# Patient Record
Sex: Male | Born: 1935 | Race: Black or African American | Hispanic: No | Marital: Married | Smoking: Former smoker
Health system: Southern US, Community
[De-identification: ages and names within clinical notes are randomized; demographics above are authoritative.]

## PROBLEM LIST (undated history)

## (undated) DIAGNOSIS — E039 Hypothyroidism, unspecified: Secondary | ICD-10-CM

## (undated) DIAGNOSIS — I2510 Atherosclerotic heart disease of native coronary artery without angina pectoris: Secondary | ICD-10-CM

## (undated) DIAGNOSIS — N189 Chronic kidney disease, unspecified: Secondary | ICD-10-CM

## (undated) DIAGNOSIS — I509 Heart failure, unspecified: Secondary | ICD-10-CM

## (undated) DIAGNOSIS — M1990 Unspecified osteoarthritis, unspecified site: Secondary | ICD-10-CM

## (undated) DIAGNOSIS — Z972 Presence of dental prosthetic device (complete) (partial): Secondary | ICD-10-CM

## (undated) DIAGNOSIS — I10 Essential (primary) hypertension: Secondary | ICD-10-CM

## (undated) DIAGNOSIS — J849 Interstitial pulmonary disease, unspecified: Secondary | ICD-10-CM

## (undated) DIAGNOSIS — E790 Hyperuricemia without signs of inflammatory arthritis and tophaceous disease: Secondary | ICD-10-CM

## (undated) DIAGNOSIS — D649 Anemia, unspecified: Secondary | ICD-10-CM

## (undated) DIAGNOSIS — G4730 Sleep apnea, unspecified: Secondary | ICD-10-CM

## (undated) DIAGNOSIS — K219 Gastro-esophageal reflux disease without esophagitis: Secondary | ICD-10-CM

## (undated) HISTORY — PX: COLONOSCOPY: SHX174

## (undated) HISTORY — DX: Anemia, unspecified: D64.9

## (undated) HISTORY — DX: Heart failure, unspecified: I50.9

## (undated) HISTORY — PX: ESOPHAGOGASTRODUODENOSCOPY: SHX1529

## (undated) HISTORY — PX: CERVICAL FUSION: SHX112

## (undated) HISTORY — PX: JOINT REPLACEMENT: SHX530

---

## 1990-05-18 HISTORY — PX: CORONARY ARTERY BYPASS GRAFT: SHX141

## 2002-07-18 DIAGNOSIS — M069 Rheumatoid arthritis, unspecified: Secondary | ICD-10-CM | POA: Insufficient documentation

## 2002-07-18 DIAGNOSIS — Z9889 Other specified postprocedural states: Secondary | ICD-10-CM | POA: Insufficient documentation

## 2004-04-02 ENCOUNTER — Ambulatory Visit: Payer: Self-pay | Admitting: Podiatry

## 2004-04-02 ENCOUNTER — Other Ambulatory Visit: Payer: Self-pay

## 2004-04-04 ENCOUNTER — Ambulatory Visit: Payer: Self-pay | Admitting: Podiatry

## 2005-07-02 ENCOUNTER — Other Ambulatory Visit: Payer: Self-pay

## 2005-07-02 ENCOUNTER — Ambulatory Visit: Payer: Self-pay | Admitting: Podiatry

## 2005-07-08 ENCOUNTER — Ambulatory Visit: Payer: Self-pay | Admitting: Podiatry

## 2008-10-04 ENCOUNTER — Ambulatory Visit: Payer: Self-pay | Admitting: Gastroenterology

## 2009-02-05 ENCOUNTER — Ambulatory Visit: Payer: Self-pay | Admitting: Rheumatology

## 2009-03-21 ENCOUNTER — Ambulatory Visit: Payer: Self-pay | Admitting: Rheumatology

## 2009-05-20 DIAGNOSIS — M4712 Other spondylosis with myelopathy, cervical region: Secondary | ICD-10-CM | POA: Insufficient documentation

## 2010-03-21 DIAGNOSIS — I10 Essential (primary) hypertension: Secondary | ICD-10-CM | POA: Insufficient documentation

## 2010-11-03 ENCOUNTER — Ambulatory Visit: Payer: Self-pay | Admitting: Podiatry

## 2010-11-07 ENCOUNTER — Ambulatory Visit: Payer: Self-pay | Admitting: Podiatry

## 2011-02-04 ENCOUNTER — Ambulatory Visit: Payer: Self-pay | Admitting: Podiatry

## 2012-02-05 DIAGNOSIS — N400 Enlarged prostate without lower urinary tract symptoms: Secondary | ICD-10-CM | POA: Insufficient documentation

## 2012-02-05 DIAGNOSIS — I2510 Atherosclerotic heart disease of native coronary artery without angina pectoris: Secondary | ICD-10-CM | POA: Insufficient documentation

## 2012-02-05 DIAGNOSIS — E7849 Other hyperlipidemia: Secondary | ICD-10-CM | POA: Insufficient documentation

## 2013-01-02 DIAGNOSIS — D638 Anemia in other chronic diseases classified elsewhere: Secondary | ICD-10-CM | POA: Insufficient documentation

## 2013-05-28 ENCOUNTER — Ambulatory Visit: Payer: Self-pay | Admitting: Internal Medicine

## 2013-07-10 DIAGNOSIS — N183 Chronic kidney disease, stage 3 unspecified: Secondary | ICD-10-CM | POA: Insufficient documentation

## 2014-01-02 DIAGNOSIS — M1990 Unspecified osteoarthritis, unspecified site: Secondary | ICD-10-CM | POA: Insufficient documentation

## 2014-04-05 DIAGNOSIS — D126 Benign neoplasm of colon, unspecified: Secondary | ICD-10-CM | POA: Insufficient documentation

## 2014-04-05 DIAGNOSIS — K635 Polyp of colon: Secondary | ICD-10-CM | POA: Insufficient documentation

## 2014-05-08 ENCOUNTER — Ambulatory Visit: Payer: Self-pay | Admitting: Physician Assistant

## 2015-06-24 ENCOUNTER — Encounter
Admission: RE | Admit: 2015-06-24 | Discharge: 2015-06-24 | Disposition: A | Discharge status: Home / Self Care | Payer: Medicare HMO | Source: Ambulatory Visit | Attending: Podiatry | Admitting: Podiatry

## 2015-06-24 DIAGNOSIS — Z01810 Encounter for preprocedural cardiovascular examination: Secondary | ICD-10-CM | POA: Insufficient documentation

## 2015-06-24 DIAGNOSIS — Z01812 Encounter for preprocedural laboratory examination: Secondary | ICD-10-CM | POA: Diagnosis present

## 2015-06-24 HISTORY — DX: Hyperuricemia without signs of inflammatory arthritis and tophaceous disease: E79.0

## 2015-06-24 HISTORY — DX: Chronic kidney disease, unspecified: N18.9

## 2015-06-24 HISTORY — DX: Essential (primary) hypertension: I10

## 2015-06-24 HISTORY — DX: Hypothyroidism, unspecified: E03.9

## 2015-06-24 HISTORY — DX: Atherosclerotic heart disease of native coronary artery without angina pectoris: I25.10

## 2015-06-24 HISTORY — DX: Unspecified osteoarthritis, unspecified site: M19.90

## 2015-06-24 LAB — BASIC METABOLIC PANEL
Anion gap: 7 (ref 5–15)
BUN: 23 mg/dL — AB (ref 6–20)
CHLORIDE: 96 mmol/L — AB (ref 101–111)
CO2: 28 mmol/L (ref 22–32)
Calcium: 9.3 mg/dL (ref 8.9–10.3)
Creatinine, Ser: 1.59 mg/dL — ABNORMAL HIGH (ref 0.61–1.24)
GFR calc Af Amer: 46 mL/min — ABNORMAL LOW (ref >60)
GFR calc non Af Amer: 40 mL/min — ABNORMAL LOW (ref >60)
GLUCOSE: 98 mg/dL (ref 65–99)
POTASSIUM: 3.8 mmol/L (ref 3.5–5.1)
Sodium: 131 mmol/L — ABNORMAL LOW (ref 135–145)

## 2015-06-24 LAB — CBC
HEMATOCRIT: 31.9 % — AB (ref 40.0–52.0)
Hemoglobin: 11.2 g/dL — ABNORMAL LOW (ref 13.0–18.0)
MCH: 33.7 pg (ref 26.0–34.0)
MCHC: 35 g/dL (ref 32.0–36.0)
MCV: 96.2 fL (ref 80.0–100.0)
PLATELETS: 155 10*3/uL (ref 150–440)
RBC: 3.32 MIL/uL — AB (ref 4.40–5.90)
RDW: 13.5 % (ref 11.5–14.5)
WBC: 4.2 10*3/uL (ref 3.8–10.6)

## 2015-06-24 NOTE — Pre-Procedure Instructions (Addendum)
Spoke to DR Laureen Abrahams regarding todays ECG (SR 1st AV Block, anteroseptal infarct age undetermined) found below  ECG dated 03/17/02 that notes possible anteroseptal infarct. No clearance requested.  Component Name Value Range  EKG Ventricular Rate 47 bpm   EKG P-R Interval 216 ms  EKG QRS Duration 106 ms  EKG Q-T Interval 474 ms  EKG QTC Calculation 419 ms  EKG Calculated P Axis 39 degrees   EKG Calculated R Axis 19 degrees   EKG Calculated T Axis 105 degrees    Result Narrative  MARKED SINUS BRADYCARDIA WITH PROLONGED PR INTERVAL CANNOT RULE OUT LEFT VENTRICULAR HYPERTROPHY POSSIBLE ANTEROSEPTAL INFARCT (POOR R WAVE PROGRESSION V1-3) , AGE UNDETERMINED WHEN COMPARED WITH ECG OF 17-Mar-2002 17:40, QUESTIONABLE CHANGE IN THE AXIS OPD /CA Electronically Signed By Priscella Mann MD, Mateo Flow, 21-Apr-2002 15:59:29   Status Results Details   Encounter Summary

## 2015-06-24 NOTE — Patient Instructions (Signed)
  Your procedure is scheduled on: Friday 07/05/15 Report to Day Surgery. [facility]ND [facility] To find out your arrival time please call [phone] between 1PM - 3PM on Thursday 07/04/15.  Remember: Instructions that are not followed completely may result in serious medical risk, up to and including death, or upon the discretion of your surgeon and anesthesiologist your surgery may need to be rescheduled.    __X__ 1. Do not eat food or drink liquids after midnight. No gum chewing or hard candies.     __X__ 2. No Alcohol for 24 hours before or after surgery.   ____ 3. Bring all medications with you on the day of surgery if instructed.    __X__ 4. Notify your doctor if there is any change in your medical condition     (cold, fever, infections).     Do not wear jewelry, make-up, hairpins, clips or nail polish.  Do not wear lotions, powders, or perfumes.   Do not shave 48 hours prior to surgery. Men may shave face and neck.  Do not bring valuables to the hospital.    [facility] is not responsible for any belongings or valuables.               Contacts, dentures or bridgework may not be worn into surgery.  Leave your suitcase in the car. After surgery it may be brought to your room.  For patients admitted to the hospital, discharge time is determined by your                treatment team.   Patients discharged the day of surgery will not be allowed to drive home.   Please read over the following fact sheets that you were given:   Surgical Site Infection Prevention   __X__ Take these medicines the morning of surgery with A SIP OF WATER:    1. LEVOTHYROXINE  2. OMEPRAZOLE  3.  TERAZOSYN  4.   5.  6.  ____ Fleet Enema (as directed)   __X__ Use CHG Soap as directed  ____ Use inhalers on the day of surgery  ____ Stop metformin 2 days prior to surgery    ____ Take 1/2 of usual insulin dose the night before surgery and none on the morning of surgery.   _X___ Stop  Coumadin/Plavix/aspirin on 06/25/2015  _X___ Stop Anti-inflammatories on 06/28/15   _X___ Stop supplements until after surgery.  HOLD [id number] AND FOLIC ACID UNTIL AFTER SURGERY  ____ Bring C-Pap to the hospital.

## 2015-06-24 NOTE — Pre-Procedure Instructions (Signed)
CBC and Met B faxed to Dr Elvina Mattes

## 2015-06-28 NOTE — Pre-Procedure Instructions (Signed)
EKG sent to Dr. Elvina Mattes and Anesthesia for review.

## 2015-06-28 NOTE — Pre-Procedure Instructions (Signed)
Lucina Mellow called Dr. Rosey Bath regarding pt's EKG done 06/24/15 as compared to EKG done on 07/02/2005.  Dr. Rosey Bath said OK to proceed.

## 2015-07-01 DIAGNOSIS — M545 Low back pain, unspecified: Secondary | ICD-10-CM | POA: Insufficient documentation

## 2015-07-05 ENCOUNTER — Encounter: Payer: Self-pay | Admitting: *Deleted

## 2015-07-05 ENCOUNTER — Encounter
Admission: RE | Disposition: A | Discharge status: Home / Self Care | Payer: Self-pay | Source: Ambulatory Visit | Attending: Podiatry

## 2015-07-05 ENCOUNTER — Ambulatory Visit: Payer: Medicare HMO | Admitting: Certified Registered Nurse Anesthetist

## 2015-07-05 ENCOUNTER — Ambulatory Visit
Admission: RE | Admit: 2015-07-05 | Discharge: 2015-07-05 | Disposition: A | Discharge status: Home / Self Care | Payer: Medicare HMO | Source: Ambulatory Visit | Attending: Podiatry | Admitting: Podiatry

## 2015-07-05 DIAGNOSIS — Z7982 Long term (current) use of aspirin: Secondary | ICD-10-CM | POA: Insufficient documentation

## 2015-07-05 DIAGNOSIS — Z86010 Personal history of colonic polyps: Secondary | ICD-10-CM | POA: Insufficient documentation

## 2015-07-05 DIAGNOSIS — Z96641 Presence of right artificial hip joint: Secondary | ICD-10-CM | POA: Diagnosis not present

## 2015-07-05 DIAGNOSIS — E790 Hyperuricemia without signs of inflammatory arthritis and tophaceous disease: Secondary | ICD-10-CM | POA: Insufficient documentation

## 2015-07-05 DIAGNOSIS — M899 Disorder of bone, unspecified: Secondary | ICD-10-CM | POA: Insufficient documentation

## 2015-07-05 DIAGNOSIS — I129 Hypertensive chronic kidney disease with stage 1 through stage 4 chronic kidney disease, or unspecified chronic kidney disease: Secondary | ICD-10-CM | POA: Diagnosis not present

## 2015-07-05 DIAGNOSIS — M069 Rheumatoid arthritis, unspecified: Secondary | ICD-10-CM | POA: Diagnosis not present

## 2015-07-05 DIAGNOSIS — M5010 Cervical disc disorder with radiculopathy, unspecified cervical region: Secondary | ICD-10-CM | POA: Diagnosis not present

## 2015-07-05 DIAGNOSIS — Q660 Congenital talipes equinovarus: Secondary | ICD-10-CM | POA: Insufficient documentation

## 2015-07-05 DIAGNOSIS — K219 Gastro-esophageal reflux disease without esophagitis: Secondary | ICD-10-CM | POA: Diagnosis not present

## 2015-07-05 DIAGNOSIS — E739 Lactose intolerance, unspecified: Secondary | ICD-10-CM | POA: Insufficient documentation

## 2015-07-05 DIAGNOSIS — G8929 Other chronic pain: Secondary | ICD-10-CM | POA: Diagnosis present

## 2015-07-05 DIAGNOSIS — E039 Hypothyroidism, unspecified: Secondary | ICD-10-CM | POA: Diagnosis not present

## 2015-07-05 DIAGNOSIS — I2510 Atherosclerotic heart disease of native coronary artery without angina pectoris: Secondary | ICD-10-CM | POA: Diagnosis not present

## 2015-07-05 DIAGNOSIS — N189 Chronic kidney disease, unspecified: Secondary | ICD-10-CM | POA: Insufficient documentation

## 2015-07-05 DIAGNOSIS — Z79899 Other long term (current) drug therapy: Secondary | ICD-10-CM | POA: Diagnosis not present

## 2015-07-05 DIAGNOSIS — N400 Enlarged prostate without lower urinary tract symptoms: Secondary | ICD-10-CM | POA: Diagnosis not present

## 2015-07-05 DIAGNOSIS — M5136 Other intervertebral disc degeneration, lumbar region: Secondary | ICD-10-CM | POA: Insufficient documentation

## 2015-07-05 DIAGNOSIS — M479 Spondylosis, unspecified: Secondary | ICD-10-CM | POA: Insufficient documentation

## 2015-07-05 HISTORY — PX: GASTROC RECESSION EXTREMITY: SHX6262

## 2015-07-05 HISTORY — PX: OSTECTOMY: SHX6439

## 2015-07-05 SURGERY — OSTECTOMY
Anesthesia: General | Site: Foot | Laterality: Left | Wound class: Clean

## 2015-07-05 MED ORDER — BUPIVACAINE HCL (PF) 0.5 % IJ SOLN
INTRAMUSCULAR | Status: AC
  Filled 2015-07-05: qty 30

## 2015-07-05 MED ORDER — CEFAZOLIN SODIUM-DEXTROSE 2-3 GM-% IV SOLR
2.0000 g | Freq: Once | INTRAVENOUS | Status: AC
  Administered 2015-07-05: 2 g via INTRAVENOUS

## 2015-07-05 MED ORDER — OXYCODONE-ACETAMINOPHEN 7.5-325 MG PO TABS: 1.0000 | ORAL_TABLET | ORAL | Status: DC | PRN

## 2015-07-05 MED ORDER — FENTANYL CITRATE (PF) 100 MCG/2ML IJ SOLN
INTRAMUSCULAR | Status: DC | PRN
  Administered 2015-07-05 (×3): 25 ug via INTRAVENOUS

## 2015-07-05 MED ORDER — LIDOCAINE-EPINEPHRINE 1 %-1:100000 IJ SOLN
INTRAMUSCULAR | Status: AC
  Filled 2015-07-05: qty 1

## 2015-07-05 MED ORDER — CEFAZOLIN SODIUM-DEXTROSE 2-3 GM-% IV SOLR
INTRAVENOUS | Status: AC
  Filled 2015-07-05: qty 50

## 2015-07-05 MED ORDER — ONDANSETRON HCL 4 MG/2ML IJ SOLN
INTRAMUSCULAR | Status: DC | PRN
  Administered 2015-07-05: 4 mg via INTRAVENOUS

## 2015-07-05 MED ORDER — FENTANYL CITRATE (PF) 100 MCG/2ML IJ SOLN
INTRAMUSCULAR | Status: AC
  Filled 2015-07-05: qty 2

## 2015-07-05 MED ORDER — PHENYLEPHRINE HCL 10 MG/ML IJ SOLN
INTRAMUSCULAR | Status: DC | PRN
  Administered 2015-07-05: 50 ug via INTRAVENOUS
  Administered 2015-07-05: 100 ug via INTRAVENOUS
  Administered 2015-07-05 (×3): 50 ug via INTRAVENOUS

## 2015-07-05 MED ORDER — LIDOCAINE-EPINEPHRINE 1 %-1:100000 IJ SOLN
INTRAMUSCULAR | Status: DC | PRN
  Administered 2015-07-05: 10 mL

## 2015-07-05 MED ORDER — VANCOMYCIN HCL 1000 MG IV SOLR
INTRAVENOUS | Status: AC
  Filled 2015-07-05: qty 1000

## 2015-07-05 MED ORDER — PROPOFOL 10 MG/ML IV BOLUS
INTRAVENOUS | Status: DC | PRN
  Administered 2015-07-05: 150 mg via INTRAVENOUS

## 2015-07-05 MED ORDER — BUPIVACAINE HCL (PF) 0.5 % IJ SOLN
INTRAMUSCULAR | Status: DC | PRN
  Administered 2015-07-05: 10 mL
  Administered 2015-07-05: 7 mL

## 2015-07-05 MED ORDER — ONDANSETRON HCL 4 MG/2ML IJ SOLN
4.0000 mg | Freq: Once | INTRAMUSCULAR | Status: DC | PRN
Start: 2015-07-05 — End: 2015-07-05

## 2015-07-05 MED ORDER — LIDOCAINE HCL (PF) 1 % IJ SOLN
INTRAMUSCULAR | Status: AC
  Filled 2015-07-05: qty 30

## 2015-07-05 MED ORDER — LIDOCAINE HCL (CARDIAC) 20 MG/ML IV SOLN
INTRAVENOUS | Status: DC | PRN
  Administered 2015-07-05: 100 mg via INTRAVENOUS

## 2015-07-05 MED ORDER — EPHEDRINE SULFATE 50 MG/ML IJ SOLN
INTRAMUSCULAR | Status: DC | PRN
  Administered 2015-07-05 (×2): 5 mg via INTRAVENOUS

## 2015-07-05 MED ORDER — GLYCOPYRROLATE 0.2 MG/ML IJ SOLN
INTRAMUSCULAR | Status: DC | PRN
  Administered 2015-07-05: 0.2 mg via INTRAVENOUS

## 2015-07-05 MED ORDER — FENTANYL CITRATE (PF) 100 MCG/2ML IJ SOLN
25.0000 ug | INTRAMUSCULAR | Status: DC | PRN
  Administered 2015-07-05: 50 ug via INTRAVENOUS

## 2015-07-05 MED ORDER — LACTATED RINGERS IV SOLN
INTRAVENOUS | Status: DC
  Administered 2015-07-05: 07:00:00 via INTRAVENOUS

## 2015-07-05 SURGICAL SUPPLY — 58 items
BAG COUNTER SPONGE EZ (MISCELLANEOUS) ×2 IMPLANT
BANDAGE ELASTIC 4 LF NS (GAUZE/BANDAGES/DRESSINGS) ×3 IMPLANT
BANDAGE STRETCH 3X4.1 STRL (GAUZE/BANDAGES/DRESSINGS) ×3 IMPLANT
BASIN GRAD PLASTIC 32OZ STRL (MISCELLANEOUS) ×3 IMPLANT
BLADE CRESCENTIC (BLADE) ×3 IMPLANT
BLADE MED AGGRESSIVE (BLADE) ×6 IMPLANT
BLADE SURG 15 STRL LF DISP TIS (BLADE) ×2 IMPLANT
BLADE SURG 15 STRL SS (BLADE) ×4
BLADE SURG MINI STRL (BLADE) ×3 IMPLANT
BNDG COHESIVE 4X5 TAN STRL (GAUZE/BANDAGES/DRESSINGS) ×3 IMPLANT
BNDG ESMARK 4X12 TAN STRL LF (GAUZE/BANDAGES/DRESSINGS) ×3 IMPLANT
BNDG GAUZE 4.5X4.1 6PLY STRL (MISCELLANEOUS) ×3 IMPLANT
BUR 4X45 EGG (BURR) ×6 IMPLANT
CANISTER SUCT 1200ML W/VALVE (MISCELLANEOUS) ×3 IMPLANT
CLOSURE WOUND 1/4X4 (GAUZE/BANDAGES/DRESSINGS) ×1
COUNTER SPONGE BAG EZ (MISCELLANEOUS) ×1
COVER PIN YLW 0.028-062 (MISCELLANEOUS) ×6 IMPLANT
CUFF TOURN 18 STER (MISCELLANEOUS) IMPLANT
CUFF TOURN 24 STER (MISCELLANEOUS) ×3 IMPLANT
CUFF TOURN DUAL PL 12 NO SLV (MISCELLANEOUS) IMPLANT
DRAPE FLUOR MINI C-ARM 54X84 (DRAPES) ×3 IMPLANT
DRESSING TELFA 4X3 1S ST N-ADH (GAUZE/BANDAGES/DRESSINGS) ×3 IMPLANT
DRSG TEGADERM 4X4.75 (GAUZE/BANDAGES/DRESSINGS) ×3 IMPLANT
DURAPREP 26ML APPLICATOR (WOUND CARE) ×3 IMPLANT
ELECT REM PT RETURN 9FT ADLT (ELECTROSURGICAL) ×3
ELECTRODE REM PT RTRN 9FT ADLT (ELECTROSURGICAL) ×1 IMPLANT
GAUZE PETRO XEROFOAM 1X8 (MISCELLANEOUS) ×3 IMPLANT
GAUZE SPONGE 4X4 12PLY STRL (GAUZE/BANDAGES/DRESSINGS) ×3 IMPLANT
GAUZE STRETCH 2X75IN STRL (MISCELLANEOUS) ×3 IMPLANT
GLOVE BIO SURGEON STRL SZ7.5 (GLOVE) ×3 IMPLANT
GLOVE BIO SURGEON STRL SZ8 (GLOVE) ×3 IMPLANT
GLOVE INDICATOR 7.5 STRL GRN (GLOVE) ×3 IMPLANT
GLOVE INDICATOR 8.0 STRL GRN (GLOVE) ×3 IMPLANT
GOWN STRL REUS W/ TWL LRG LVL3 (GOWN DISPOSABLE) ×1 IMPLANT
GOWN STRL REUS W/TWL LRG LVL3 (GOWN DISPOSABLE) ×2
KIT RM TURNOVER STRD PROC AR (KITS) ×3 IMPLANT
LABEL OR SOLS (LABEL) ×3 IMPLANT
NDL SAFETY 18GX1.5 (NEEDLE) ×3 IMPLANT
NEEDLE FILTER BLUNT 18X 1/2SAF (NEEDLE) ×2
NEEDLE FILTER BLUNT 18X1 1/2 (NEEDLE) ×1 IMPLANT
NEEDLE HYPO 25X1 1.5 SAFETY (NEEDLE) ×6 IMPLANT
NS IRRIG 500ML POUR BTL (IV SOLUTION) ×3 IMPLANT
PACK EXTREMITY ARMC (MISCELLANEOUS) ×3 IMPLANT
PENCIL ELECTRO HAND CTR (MISCELLANEOUS) ×3 IMPLANT
RASP SM TEAR CROSS CUT (RASP) ×3 IMPLANT
SPLINT FAST PLASTER 5X30 (CAST SUPPLIES) ×2
SPLINT PLASTER CAST FAST 5X30 (CAST SUPPLIES) ×1 IMPLANT
STOCKINETTE M/LG 89821 (MISCELLANEOUS) IMPLANT
STOCKINETTE STRL 6IN 960660 (GAUZE/BANDAGES/DRESSINGS) ×3 IMPLANT
STRIP CLOSURE SKIN 1/4X4 (GAUZE/BANDAGES/DRESSINGS) ×2 IMPLANT
SUT ETHILON 4-0 (SUTURE) ×2
SUT ETHILON 4-0 FS2 18XMFL BLK (SUTURE) ×1
SUT VIC AB 4-0 FS2 27 (SUTURE) ×6 IMPLANT
SUT VICRYL AB 3-0 FS1 BRD 27IN (SUTURE) ×3 IMPLANT
SUTURE ETHLN 4-0 FS2 18XMF BLK (SUTURE) ×1 IMPLANT
SYR 3ML LL SCALE MARK (SYRINGE) ×3 IMPLANT
SYRINGE 10CC LL (SYRINGE) ×6 IMPLANT
WIRE Z .062 C-WIRE SPADE TIP (WIRE) ×6 IMPLANT

## 2015-07-05 NOTE — H&P (Signed)
H and P has been reviewed and no changes are noted.  

## 2015-07-05 NOTE — Anesthesia Preprocedure Evaluation (Signed)
Anesthesia Evaluation  Patient identified by MRN, date of birth, ID band Patient awake    Reviewed: Allergy & Precautions, H&P , NPO status , Patient's Chart, lab work & pertinent test results, reviewed documented beta blocker date and time   History of Anesthesia Complications Negative for: history of anesthetic complications  Airway Mallampati: III  TM Distance: >3 FB Neck ROM: full    Dental no notable dental hx. (+) Edentulous Upper, Partial Lower, Missing   Pulmonary neg shortness of breath, sleep apnea and Continuous Positive Airway Pressure Ventilation , neg COPD, neg recent URI, former smoker,    Pulmonary exam normal breath sounds clear to auscultation       Cardiovascular Exercise Tolerance: Good hypertension, (-) angina+ CAD and + CABG (in 1992)  (-) Past MI and (-) Cardiac Stents Normal cardiovascular exam(-) dysrhythmias (-) Valvular Problems/Murmurs Rhythm:regular Rate:Normal     Neuro/Psych negative neurological ROS  negative psych ROS   GI/Hepatic Neg liver ROS, GERD  Medicated,  Endo/Other  neg diabetesHypothyroidism   Renal/GU CRFRenal disease  negative genitourinary   Musculoskeletal   Abdominal   Peds  Hematology negative hematology ROS (+)   Anesthesia Other Findings Past Medical History:   Coronary artery disease                                      Hypertension                                                 Hypothyroidism                                               Chronic kidney disease                                         Comment:renal insufficiency   Arthritis                                                      Comment:rheumatoid, osteoarthritis   Hyperuricemia                                                Reproductive/Obstetrics negative OB ROS                             Anesthesia Physical Anesthesia Plan  ASA: III  Anesthesia Plan: General    Post-op Pain Management:    Induction:   Airway Management Planned:   Additional Equipment:   Intra-op Plan:   Post-operative Plan:   Informed Consent: I have reviewed the patients History and Physical, chart, labs and discussed the procedure including the risks, benefits and alternatives for the proposed anesthesia with the patient or authorized  representative who has indicated his/her understanding and acceptance.   Dental Advisory Given  Plan Discussed with: Anesthesiologist, CRNA and Surgeon  Anesthesia Plan Comments:         Anesthesia Quick Evaluation

## 2015-07-05 NOTE — Discharge Instructions (Signed)
[place] DR. Mora   1. Take your medication as prescribed.  Pain medication should be taken only as needed.  2. Keep the dressing clean, dry and intact.  3. Keep your foot elevated above the heart level for the first 48 hours.  4. Walking to the bathroom and brief periods of walking are acceptable, unless we have instructed you to be non-weight bearing.  5. Always wear your post-op shoe when walking.  Always use yourwalker if you are to be partial-weight bearing.  6. Do not take a shower. Baths are permissible as long as the foot is kept out of the water.   7. Every hour you are awake:  - Bend your knee 15 times. - Flex foot 15 times - Massage calf 15 times  8. Call [facility] [phone]) if any of the following problems occur: - You develop a temperature or fever. - The bandage becomes saturated with blood. - Medication does not stop your pain. - Injury of the foot occurs. - Any symptoms of infection including redness, odor, or red streaks running from wound. AMBULATORY SURGERY  DISCHARGE INSTRUCTIONS   The drugs that you were given will stay in your system until tomorrow so for the next 24 hours you should not:  Drive an automobile Make any legal decisions Drink any alcoholic beverage   You may resume regular meals tomorrow.  Today it is better to start with liquids and gradually work up to solid foods.  You may eat anything you prefer, but it is better to start with liquids, then soup and crackers, and gradually work up to solid foods.   Please notify your doctor immediately if you have any unusual bleeding, trouble breathing, redness and pain at the surgery site, drainage, fever, or pain not relieved by medication.    Additional Instructions:        Please contact your physician with any problems or Same Day Surgery  at [phone], Monday through Friday 6 am to 4 pm, or [place] at [facility] number at [phone].

## 2015-07-05 NOTE — Transfer of Care (Signed)
Immediate Anesthesia Transfer of Care Note  Patient: Edwin Christensen  Procedure(s) Performed: Procedure(s) with comments: OSTECTOMY 4th & 5th metatarsals left foot (Left) - LMA w/ local GASTROC RECESSION EXTREMITY (Left) - LMA w/ local  Patient Location: PACU  Anesthesia Type:General  Level of Consciousness: sedated  Airway & Oxygen Therapy: Patient Spontanous Breathing and Patient connected to face mask oxygen  Post-op Assessment: Report given to RN and Post -op Vital signs reviewed and stable  Post vital signs: Reviewed and stable  Last Vitals:  Filed Vitals:   07/05/15 0619 07/05/15 0913  BP: 148/94 114/74  Pulse: 70 90  Temp: 36.3 C 36.3 C  Resp: 18 15    Complications: No apparent anesthesia complications

## 2015-07-05 NOTE — Anesthesia Postprocedure Evaluation (Signed)
Anesthesia Post Note  Patient: Edwin Christensen  Procedure(s) Performed: Procedure(s) (LRB): OSTECTOMY 4th & 5th metatarsals left foot (Left) GASTROC RECESSION EXTREMITY (Left)  Patient location during evaluation: PACU Anesthesia Type: General Level of consciousness: awake and alert Pain management: pain level controlled Vital Signs Assessment: post-procedure vital signs reviewed and stable Respiratory status: spontaneous breathing, nonlabored ventilation, respiratory function stable and patient connected to nasal cannula oxygen Cardiovascular status: blood pressure returned to baseline and stable Postop Assessment: no signs of nausea or vomiting Anesthetic complications: no    Last Vitals:  Filed Vitals:   07/05/15 1010 07/05/15 1102  BP: 150/86 146/89  Pulse: 74 72  Temp: 36.5 C 36.5 C  Resp: 14 14    Last Pain:  Filed Vitals:   07/05/15 1105  PainSc: 0-No pain                 Martha Clan

## 2015-07-05 NOTE — Op Note (Signed)
Operative note   Surgeon: Dr. Albertine Patricia, DPM.    Assistant: None    Preop diagnosis: #1 gastroc equinus left. #2 exostosis fourth metatarsal left. #3 exostosis fifth metatarsal left    Postop diagnosis: Same    Procedure:   1. Gastroc recession left   2. Resection partial metatarsal distal portion fourth left   3. Resection partial metatarsal distal portion fifth left     EBL: Less than 10 cc    Anesthesia:general deliver about anesthesia team and local anesthesia delivered by me.    Hemostasis: Thigh tourniquet 325 mmHg pressure for 35 minutes    Specimen: None    Complications: None    Operative indications: Chronic pain secondary to tight Achilles tendon and prominent metatarsal heads 4 and 5 on the left foot. Conservative treatment. Ineffective.    Procedure:  Patient was brought into the OR and placed on the operating table in thesupine position. After anesthesia was obtained theleft lower extremity was prepped and draped in usual sterile fashion.  Operative Report: This time the left foot was placed in an ankle harness and lifted with foot and leg above the operating room table to expose the posterior gastrocnemius tendon. A 3 cm linear incision was made just medial to the midline and deepened sharp blunt dissection and an area above the gastroc soleal junction. Bleeders clamped and bovied as required. The incision was deepened with sharp blunt dissection the tendon sheath was identified and incised longitudinally and the peritenon also was incised longitudinally. The gastrocnemius tendon was notified at this point and released from medial to lateral utilizing a 15 blade while dorsiflexing the foot at the ankle joint. Once this was completed the tendon separated nicely to allow for greater dorsiflexion at the ankle joint. This time there was copiously irrigated the peritenon was repaired with 4 Vicryl continuous stitch as was the tendon sheath. Deep superficial fascial  layers also closed with 4 Vicryl continuous stitch. Skin closed with 4 Vicryl subcuticular fashion.  This time to his directed to the fourth metatarsophalangeal joint where a 3 cm linear incision was made through her previous scar margin and incision margin to that area. Incision was deepened sharp blunt dissection bleeders clamped and bovied as required extensor tendon was retracted laterally and incision made through the capsule tissue down to bone freeing the capsular periosteal tissue mediolaterally away from the fourth metatarsal distally. This time a combination of a crescentic blade and a sagittal saw were used to resect the residual portions of the fourth metatarsal head remove the prominent exostosis distally and plantarly. Once this was freed up there was copiously irrigated checked FluoroScan good reduction and removal of bone was noted. The area was then closed with 4 Vicryl continuous stitch to close capsule and deep superficial fascial layers. Skin closed with 4 Vicryl in a subcuticular stitch.  At this time attention directed to the fifth metatarsophalangeal joint area where similar procedure was performed as that on the fourth.  After a block with 0.5% Marcaine plain the area was dressed with sterile compressive dressing consisting of Steri-Strips Xeroform gauze 4 x 4's Kling and Kerlix. The Achilles tendon area been dressed earlier. The tourniquet is released and prompt complete vascularity was seen to return all digits the left foot.     Patient tolerated the procedure and anesthesia well.  Was transported from the OR to the PACU with all vital signs stable and vascular status intact. To be discharged per routine protocol.  Will follow up  in approximately 1 week in the outpatient clinic.

## 2015-07-05 NOTE — Anesthesia Procedure Notes (Signed)
Procedure Name: LMA Insertion Performed by: Myonna Chisom Pre-anesthesia Checklist: Patient identified, Patient being monitored, Timeout performed, Emergency Drugs available and Suction available Patient Re-evaluated:Patient Re-evaluated prior to inductionOxygen Delivery Method: Circle system utilized Preoxygenation: Pre-oxygenation with 100% oxygen Intubation Type: IV induction Ventilation: Mask ventilation without difficulty LMA: LMA inserted LMA Size: 5.0 Tube type: Oral Number of attempts: 1 Placement Confirmation: positive ETCO2 and breath sounds checked- equal and bilateral Tube secured with: Tape Dental Injury: Teeth and Oropharynx as per pre-operative assessment      

## 2015-07-16 ENCOUNTER — Ambulatory Visit
Admission: EM | Admit: 2015-07-16 | Discharge: 2015-07-16 | Disposition: A | Discharge status: Home / Self Care | Payer: Medicare HMO | Attending: Family Medicine | Admitting: Family Medicine

## 2015-07-16 ENCOUNTER — Encounter: Payer: Self-pay | Admitting: Emergency Medicine

## 2015-07-16 DIAGNOSIS — J0100 Acute maxillary sinusitis, unspecified: Secondary | ICD-10-CM | POA: Diagnosis not present

## 2015-07-16 MED ORDER — AMOXICILLIN 875 MG PO TABS: 875.0000 mg | ORAL_TABLET | Freq: Two times a day (BID) | ORAL | Status: DC

## 2015-07-16 NOTE — ED Notes (Signed)
Patient c/o stuffy nose, nasal congestion and headache for 2 days.  Patient denies fevers.

## 2015-07-16 NOTE — ED Provider Notes (Signed)
CSN: [id number]     Arrival date & time 07/16/15  1047 History   First MD Initiated Contact with Patient 07/16/15 1403     Chief Complaint  Patient presents with  . Nasal Congestion  . Headache   (Consider location/radiation/quality/duration/timing/severity/associated sxs/prior Treatment) Patient is a 81 y.o. male presenting with URI. The history is provided by the patient.  URI Presenting symptoms: congestion, cough, facial pain, fatigue, fever and rhinorrhea   Severity:  Moderate Onset quality:  Sudden Duration:  7 days Timing:  Constant Progression:  Worsening Relieved by:  Nothing Ineffective treatments:  OTC medications Associated symptoms: headaches and sinus pain   Associated symptoms: no arthralgias, no myalgias, no neck pain, no sneezing, no swollen glands and no wheezing   Risk factors: being elderly, chronic cardiac disease, chronic kidney disease and sick contacts   Risk factors: no chronic respiratory disease, no diabetes mellitus, no immunosuppression, no recent illness and no recent travel     Past Medical History  Diagnosis Date  . Coronary artery disease   . Hypertension   . Hypothyroidism   . Chronic kidney disease     renal insufficiency  . Arthritis     rheumatoid, osteoarthritis  . Hyperuricemia    Past Surgical History  Procedure Laterality Date  . Joint replacement Right     hip  . Esophagogastroduodenoscopy    . Colonoscopy    . Coronary artery bypass graft    . Ostectomy Left 07/05/2015    Procedure: OSTECTOMY 4th & 5th metatarsals left foot;  Surgeon: Albertine Patricia, DPM;  Location: [facility] ORS;  Service: Podiatry;  Laterality: Left;  LMA w/ local  . Gastroc recession extremity Left 07/05/2015    Procedure: GASTROC RECESSION EXTREMITY;  Surgeon: Albertine Patricia, DPM;  Location: [facility] ORS;  Service: Podiatry;  Laterality: Left;  LMA w/ local   History reviewed. No pertinent family history. Social History  Substance Use Topics  . Smoking status:  Former Smoker    Quit date: 06/23/1973  . Smokeless tobacco: Never Used  . Alcohol Use: No    Review of Systems  Constitutional: Positive for fever and fatigue.  HENT: Positive for congestion and rhinorrhea. Negative for sneezing.   Respiratory: Positive for cough. Negative for wheezing.   Musculoskeletal: Negative for myalgias, arthralgias and neck pain.  Neurological: Positive for headaches.    Allergies  Review of patient's allergies indicates no known allergies.  Home Medications   Prior to Admission medications   Medication Sig Start Date End Date Taking? Authorizing Provider  acetaminophen (TYLENOL) 325 MG tablet Take 650 mg by mouth as needed.    Historical Provider, MD  amoxicillin (AMOXIL) 875 MG tablet Take 1 tablet (875 mg total) by mouth 2 (two) times daily. 07/16/15   Norval Gable, MD  aspirin 325 MG tablet Take 325 mg by mouth daily.    Historical Provider, MD  chlorthalidone (HYGROTON) 25 MG tablet Take 25 mg by mouth daily.    Historical Provider, MD  cyanocobalamin (,VITAMIN B-12,) 1000 MCG/ML injection Inject 1,000 mcg into the muscle every 30 (thirty) days.    Historical Provider, MD  folic acid (FOLVITE) 1 MG tablet Take 1 mg by mouth daily.    Historical Provider, MD  hydroxychloroquine (PLAQUENIL) 200 MG tablet Take 200 mg by mouth daily.    Historical Provider, MD  levothyroxine (SYNTHROID, LEVOTHROID) 25 MCG tablet Take 25 mcg by mouth daily before breakfast.    Historical Provider, MD  methotrexate (RHEUMATREX) 2.5 MG tablet Take  12.5 mg by mouth once a week.    Historical Provider, MD  omeprazole (PRILOSEC) 40 MG capsule Take 40 mg by mouth daily.    Historical Provider, MD  oxyCODONE-acetaminophen (PERCOCET) 7.5-325 MG tablet Take 1 tablet by mouth every 4 (four) hours as needed for severe pain. 07/05/15   Albertine Patricia, DPM  predniSONE (DELTASONE) 5 MG tablet Take 5 mg by mouth.    Historical Provider, MD  simvastatin (ZOCOR) 40 MG tablet Take 40 mg by  mouth daily.    Historical Provider, MD  terazosin (HYTRIN) 2 MG capsule Take 2 mg by mouth at bedtime.    Historical Provider, MD  terazosin (HYTRIN) 5 MG capsule Take 5 mg by mouth daily.    Historical Provider, MD   Meds Ordered and Administered this Visit  Medications - No data to display  BP 145/85 mmHg  Pulse 62  Temp(Src) 97.1 F (36.2 C) (Tympanic)  Resp 16  Ht 6\' 2"  (1.88 m)  Wt 211 lb (95.709 kg)  BMI 27.08 kg/m2  SpO2 100% No data found.   Physical Exam  Constitutional: He appears well-developed and well-nourished. No distress.  HENT:  Head: Normocephalic and atraumatic.  Right Ear: Tympanic membrane, external ear and ear canal normal.  Left Ear: Tympanic membrane, external ear and ear canal normal.  Nose: Mucosal edema and rhinorrhea present. Right sinus exhibits maxillary sinus tenderness and frontal sinus tenderness. Left sinus exhibits maxillary sinus tenderness and frontal sinus tenderness.  Mouth/Throat: Uvula is midline, oropharynx is clear and moist and mucous membranes are normal. No oropharyngeal exudate or tonsillar abscesses.  Eyes: Conjunctivae and EOM are normal. Pupils are equal, round, and reactive to light. Right eye exhibits no discharge. Left eye exhibits no discharge. No scleral icterus.  Neck: Normal range of motion. Neck supple. No tracheal deviation present. No thyromegaly present.  Cardiovascular: Normal rate, regular rhythm and normal heart sounds.   Pulmonary/Chest: Effort normal and breath sounds normal. No stridor. No respiratory distress. He has no wheezes. He has no rales. He exhibits no tenderness.  Lymphadenopathy:    He has no cervical adenopathy.  Neurological: He is alert.  Skin: Skin is warm and dry. No rash noted. He is not diaphoretic.  Nursing note and vitals reviewed.   ED Course  Procedures (including critical care time)  Labs Review Labs Reviewed - No data to display  Imaging Review No results found.   Visual Acuity  Review  Right Eye Distance:   Left Eye Distance:   Bilateral Distance:    Right Eye Near:   Left Eye Near:    Bilateral Near:         MDM   1. Acute maxillary sinusitis, recurrence not specified    Discharge Medication List as of 07/16/2015  2:12 PM    START taking these medications   Details  amoxicillin (AMOXIL) 875 MG tablet Take 1 tablet (875 mg total) by mouth 2 (two) times daily., Starting 07/16/2015, Until Discontinued, Normal       1. Labs/x-ray results and diagnosis reviewed with patient/parent/guardian/family 2. rx as per orders above; reviewed possible side effects, interactions, risks and benefits  3. Recommend supportive treatment with otc analgesics prn, otc flonase 4. Follow-up prn if symptoms worsen or don't improve     Norval Gable, MD 07/16/15 [id number]

## 2015-10-24 ENCOUNTER — Ambulatory Visit
Admission: EM | Admit: 2015-10-24 | Discharge: 2015-10-24 | Disposition: A | Discharge status: Home / Self Care | Payer: Medicare HMO | Attending: Family Medicine | Admitting: Family Medicine

## 2015-10-24 DIAGNOSIS — R05 Cough: Secondary | ICD-10-CM

## 2015-10-24 DIAGNOSIS — R059 Cough, unspecified: Secondary | ICD-10-CM

## 2015-10-24 DIAGNOSIS — J019 Acute sinusitis, unspecified: Secondary | ICD-10-CM | POA: Diagnosis not present

## 2015-10-24 MED ORDER — LORATADINE-PSEUDOEPHEDRINE ER 5-120 MG PO TB12: 1.0000 | ORAL_TABLET | Freq: Two times a day (BID) | ORAL | Status: DC

## 2015-10-24 MED ORDER — AMOXICILLIN-POT CLAVULANATE 875-125 MG PO TABS: 1.0000 | ORAL_TABLET | Freq: Two times a day (BID) | ORAL | Status: DC

## 2015-10-24 MED ORDER — BENZONATATE 100 MG PO CAPS
100.0000 mg | ORAL_CAPSULE | Freq: Three times a day (TID) | ORAL | Status: DC | PRN
Start: 2015-10-24 — End: 2016-05-08

## 2015-10-24 NOTE — Discharge Instructions (Signed)

## 2015-10-24 NOTE — ED Provider Notes (Signed)
CSN: [id number]     Arrival date & time 10/24/15  0804 History   First MD Initiated Contact with Patient 10/24/15 0827       Nurses notes were reviewed. note Chief Complaint  Patient presents with  . Cough    Cough worse at night, congestion, and "sinus" headache. Pain 5/10   Patient symptoms of cough. He states about a week she's been coughing. The coughing has gotten worse at night but he does cough during the day as well. He does not have a history of reflux he can't really say that he's had anything like this before but he has had nasal congestion for about 2 weeks. He states that last night he was unable to sleep because of recurrent coughing. He denies a sore throat. And he is not coughing up anything but occasional mucus.  Past history his had a history of hypertension coronary artery disease with get with coronary artery bypass graft ,hypothyroidism chronic kidney disease. He also has gout and rheumatoid and osteoarthritis as well.     (Consider location/radiation/quality/duration/timing/severity/associated sxs/prior Treatment) Patient is a 81 y.o. male presenting with cough.  Cough Cough characteristics:  Productive and non-productive Sputum characteristics:  Nondescript and frothy Severity:  Mild Timing:  Sporadic Progression:  Waxing and waning Context: not exposure to allergens and not occupational exposure   Relieved by:  Nothing Ineffective treatments:  None tried Associated symptoms: rhinorrhea and sinus congestion   Associated symptoms: no chest pain, no chills, no diaphoresis, no ear fullness, no shortness of breath and no wheezing   Risk factors: no chemical exposure and no recent infection     Past Medical History  Diagnosis Date  . Coronary artery disease   . Hypertension   . Hypothyroidism   . Chronic kidney disease     renal insufficiency  . Arthritis     rheumatoid, osteoarthritis  . Hyperuricemia    Past Surgical History  Procedure Laterality Date   . Joint replacement Right     hip  . Esophagogastroduodenoscopy    . Colonoscopy    . Coronary artery bypass graft    . Ostectomy Left 07/05/2015    Procedure: OSTECTOMY 4th & 5th metatarsals left foot;  Surgeon: Albertine Patricia, DPM;  Location: [facility] ORS;  Service: Podiatry;  Laterality: Left;  LMA w/ local  . Gastroc recession extremity Left 07/05/2015    Procedure: GASTROC RECESSION EXTREMITY;  Surgeon: Albertine Patricia, DPM;  Location: [facility] ORS;  Service: Podiatry;  Laterality: Left;  LMA w/ local   History reviewed. No pertinent family history. Social History  Substance Use Topics  . Smoking status: Former Smoker    Quit date: 06/23/1973  . Smokeless tobacco: Never Used  . Alcohol Use: No    Review of Systems  Constitutional: Negative for chills and diaphoresis.  HENT: Positive for rhinorrhea.   Respiratory: Positive for cough. Negative for shortness of breath and wheezing.   Cardiovascular: Negative for chest pain.  All other systems reviewed and are negative.   Allergies  Review of patient's allergies indicates no known allergies.  Home Medications   Prior to Admission medications   Medication Sig Start Date End Date Taking? Authorizing Provider  acetaminophen (TYLENOL) 325 MG tablet Take 650 mg by mouth as needed.   Yes Historical Provider, MD  aspirin 325 MG tablet Take 325 mg by mouth daily.   Yes Historical Provider, MD  chlorthalidone (HYGROTON) 25 MG tablet Take 25 mg by mouth daily.   Yes Historical Provider,  MD  cyanocobalamin (,VITAMIN B-12,) 1000 MCG/ML injection Inject 1,000 mcg into the muscle every 30 (thirty) days.   Yes Historical Provider, MD  folic acid (FOLVITE) 1 MG tablet Take 1 mg by mouth daily.   Yes Historical Provider, MD  hydroxychloroquine (PLAQUENIL) 200 MG tablet Take 200 mg by mouth daily.   Yes Historical Provider, MD  levothyroxine (SYNTHROID, LEVOTHROID) 25 MCG tablet Take 25 mcg by mouth daily before breakfast.   Yes Historical Provider,  MD  methotrexate (RHEUMATREX) 2.5 MG tablet Take 12.5 mg by mouth once a week.   Yes Historical Provider, MD  omeprazole (PRILOSEC) 40 MG capsule Take 40 mg by mouth daily.   Yes Historical Provider, MD  simvastatin (ZOCOR) 40 MG tablet Take 40 mg by mouth daily.   Yes Historical Provider, MD  terazosin (HYTRIN) 2 MG capsule Take 2 mg by mouth at bedtime.   Yes Historical Provider, MD  terazosin (HYTRIN) 5 MG capsule Take 5 mg by mouth daily.   Yes Historical Provider, MD  amoxicillin (AMOXIL) 875 MG tablet Take 1 tablet (875 mg total) by mouth 2 (two) times daily. 07/16/15   Norval Gable, MD  amoxicillin-clavulanate (AUGMENTIN) 875-125 MG tablet Take 1 tablet by mouth 2 (two) times daily. 10/24/15   Frederich Cha, MD  benzonatate (TESSALON) 100 MG capsule Take 1 capsule (100 mg total) by mouth 3 (three) times daily as needed for cough. 10/24/15   Frederich Cha, MD  loratadine-pseudoephedrine (CLARITIN-D 12 HOUR) 5-120 MG tablet Take 1 tablet by mouth 2 (two) times daily. 10/24/15   Frederich Cha, MD  oxyCODONE-acetaminophen (PERCOCET) 7.5-325 MG tablet Take 1 tablet by mouth every 4 (four) hours as needed for severe pain. 07/05/15   Albertine Patricia, DPM  predniSONE (DELTASONE) 5 MG tablet Take 5 mg by mouth.    Historical Provider, MD   Meds Ordered and Administered this Visit  Medications - No data to display  BP 126/77 mmHg  Pulse 59  Temp(Src) 97.7 F (36.5 C) (Oral)  Resp 16  Ht 6\' 2"  (1.88 m)  Wt 206 lb (93.441 kg)  BMI 26.44 kg/m2  SpO2 100% No data found.   Physical Exam  Constitutional: He is oriented to person, place, and time. He appears well-developed and well-nourished.  HENT:  Head: Normocephalic and atraumatic.  Right Ear: Hearing, tympanic membrane, external ear and ear canal normal.  Left Ear: Hearing, tympanic membrane, external ear and ear canal normal.  Nose: Mucosal edema and rhinorrhea present.  Mouth/Throat: Uvula is midline and mucous membranes are normal. No  oropharyngeal exudate or posterior oropharyngeal edema.  Eyes: Conjunctivae are normal. Pupils are equal, round, and reactive to light.  Neck: Normal range of motion. Neck supple.  Cardiovascular: Normal rate and regular rhythm.   Pulmonary/Chest: Effort normal and breath sounds normal.  Musculoskeletal: Normal range of motion. He exhibits no edema.  Lymphadenopathy:    He has no cervical adenopathy.  Neurological: He is alert and oriented to person, place, and time.  Skin: Skin is warm and dry.  Vitals reviewed.   ED Course  Procedures (including critical care time)  Labs Review Labs Reviewed - No data to display  Imaging Review No results found.   Visual Acuity Review  Right Eye Distance:   Left Eye Distance:   Bilateral Distance:    Right Eye Near:   Left Eye Near:    Bilateral Near:         MDM   1. Acute sinusitis, recurrence not specified,  unspecified location   2. Cough    We'll place patient on Claritin-D 12 hours 1 tablet once or twice a day as needed. Augmentin 875 one tablet twice a day. Tessalon Perles 200 mg 1 tablet 3 Times A Day When Necessary for Cough Follow up with PCP as needed.   Note: This dictation was prepared with Dragon dictation along with smaller phrase technology. Any transcriptional errors that result from this process are unintentional.    Frederich Cha, MD 10/24/15 1115

## 2016-03-03 ENCOUNTER — Other Ambulatory Visit: Payer: Self-pay | Admitting: Rheumatology

## 2016-03-03 DIAGNOSIS — M25511 Pain in right shoulder: Secondary | ICD-10-CM

## 2016-03-17 ENCOUNTER — Ambulatory Visit
Admission: RE | Admit: 2016-03-17 | Discharge: 2016-03-17 | Disposition: A | Discharge status: Home / Self Care | Payer: Medicare HMO | Source: Ambulatory Visit | Attending: Rheumatology | Admitting: Rheumatology

## 2016-03-17 DIAGNOSIS — M7551 Bursitis of right shoulder: Secondary | ICD-10-CM | POA: Diagnosis not present

## 2016-03-17 DIAGNOSIS — M25511 Pain in right shoulder: Secondary | ICD-10-CM | POA: Diagnosis present

## 2016-03-17 DIAGNOSIS — M659 Synovitis and tenosynovitis, unspecified: Secondary | ICD-10-CM | POA: Insufficient documentation

## 2016-05-08 ENCOUNTER — Ambulatory Visit
Admission: EM | Admit: 2016-05-08 | Discharge: 2016-05-08 | Disposition: A | Discharge status: Home / Self Care | Payer: Medicare HMO | Attending: Internal Medicine | Admitting: Internal Medicine

## 2016-05-08 ENCOUNTER — Encounter: Payer: Self-pay | Admitting: *Deleted

## 2016-05-08 DIAGNOSIS — B9789 Other viral agents as the cause of diseases classified elsewhere: Secondary | ICD-10-CM | POA: Diagnosis not present

## 2016-05-08 DIAGNOSIS — J988 Other specified respiratory disorders: Secondary | ICD-10-CM

## 2016-05-08 MED ORDER — AMOXICILLIN-POT CLAVULANATE 875-125 MG PO TABS: 1.0000 | ORAL_TABLET | Freq: Two times a day (BID) | ORAL | 0 refills | Status: AC

## 2016-05-08 MED ORDER — BENZONATATE 100 MG PO CAPS: 100.0000 mg | ORAL_CAPSULE | Freq: Three times a day (TID) | ORAL | 0 refills | Status: DC | PRN

## 2016-05-08 NOTE — ED Triage Notes (Signed)
Productive cough- yellow, headache, chills, x2 days.

## 2016-05-08 NOTE — Discharge Instructions (Signed)
Recommend start Benzonatate cough pills 1 every 8 hours as needed. Increase fluid intake to help loosen up mucus. Rest. If symptoms do not improve within 24 to 48 hours, recommend start Augmentin twice a day as directed. Follow-up  with your primary care provider in 4 to 5 days if not improving or go to ER if symptoms worsen.

## 2016-05-08 NOTE — ED Provider Notes (Signed)
CSN: [id number]     Arrival date & time 05/08/16  0902 History   First MD Initiated Contact with Patient 05/08/16 [phone]     Chief Complaint  Patient presents with  . Cough  . Headache   (Consider location/radiation/quality/duration/timing/severity/associated sxs/prior Treatment) 81 year old male presents with cough, headache, sinus pressure, chills and malaise for the past 2 to 3 days. Denies any fever or GI symptoms. Has taken Tylenol, Coricidan and Robitussin with minimal relief. Cough has been keeping him up at night. Has done well in the past with Benzonatate. Wife has similar symptoms- was seen here 2 days ago- dx with viral illness, possible flu. Did not take Tamiflu since too expensive but starting to feel better. History of rheumatoid arthritis, thyroid disease, chronic kidney disease, CAD- on multiple chronic medication. In no acute distress today.    The history is provided by the patient.    Past Medical History:  Diagnosis Date  . Arthritis    rheumatoid, osteoarthritis  . Chronic kidney disease    renal insufficiency  . Coronary artery disease   . Hypertension   . Hyperuricemia   . Hypothyroidism    Past Surgical History:  Procedure Laterality Date  . COLONOSCOPY    . CORONARY ARTERY BYPASS GRAFT    . ESOPHAGOGASTRODUODENOSCOPY    . GASTROC RECESSION EXTREMITY Left 07/05/2015   Procedure: GASTROC RECESSION EXTREMITY;  Surgeon: Albertine Patricia, DPM;  Location: [facility] ORS;  Service: Podiatry;  Laterality: Left;  LMA w/ local  . JOINT REPLACEMENT Right    hip  . OSTECTOMY Left 07/05/2015   Procedure: OSTECTOMY 4th & 5th metatarsals left foot;  Surgeon: Albertine Patricia, DPM;  Location: [facility] ORS;  Service: Podiatry;  Laterality: Left;  LMA w/ local   History reviewed. No pertinent family history. Social History  Substance Use Topics  . Smoking status: Former Smoker    Quit date: 06/23/1973  . Smokeless tobacco: Never Used  . Alcohol use No    Review of Systems   Constitutional: Positive for chills and fatigue. Negative for appetite change and fever.  HENT: Positive for congestion (chest) and sinus pressure. Negative for ear discharge, ear pain, nosebleeds, postnasal drip, sinus pain and sore throat.   Eyes: Negative for discharge.  Respiratory: Positive for cough. Negative for chest tightness, shortness of breath and wheezing.   Cardiovascular: Negative for chest pain.  Gastrointestinal: Negative for abdominal pain, diarrhea, nausea and vomiting.  Musculoskeletal: Negative for neck pain and neck stiffness.  Skin: Negative for rash.  Neurological: Positive for headaches. Negative for dizziness, syncope, weakness and light-headedness.  Hematological: Negative for adenopathy.    Allergies  Patient has no known allergies.  Home Medications   Prior to Admission medications   Medication Sig Start Date End Date Taking? Authorizing Provider  acetaminophen (TYLENOL) 325 MG tablet Take 650 mg by mouth as needed.   Yes Historical Provider, MD  aspirin 325 MG tablet Take 325 mg by mouth daily.   Yes Historical Provider, MD  chlorthalidone (HYGROTON) 25 MG tablet Take 25 mg by mouth daily.   Yes Historical Provider, MD  cyanocobalamin (,VITAMIN B-12,) 1000 MCG/ML injection Inject 1,000 mcg into the muscle every 30 (thirty) days.   Yes Historical Provider, MD  hydroxychloroquine (PLAQUENIL) 200 MG tablet Take 200 mg by mouth daily.   Yes Historical Provider, MD  levothyroxine (SYNTHROID, LEVOTHROID) 25 MCG tablet Take 25 mcg by mouth daily before breakfast.   Yes Historical Provider, MD  methotrexate (RHEUMATREX) 2.5 MG  tablet Take 12.5 mg by mouth once a week.   Yes Historical Provider, MD  omeprazole (PRILOSEC) 40 MG capsule Take 40 mg by mouth daily.   Yes Historical Provider, MD  simvastatin (ZOCOR) 40 MG tablet Take 40 mg by mouth daily.   Yes Historical Provider, MD  terazosin (HYTRIN) 2 MG capsule Take 2 mg by mouth at bedtime.   Yes Historical  Provider, MD  amoxicillin-clavulanate (AUGMENTIN) 875-125 MG tablet Take 1 tablet by mouth every 12 (twelve) hours. 05/08/16 05/15/16  Katy Apo, NP  benzonatate (TESSALON) 100 MG capsule Take 1 capsule (100 mg total) by mouth 3 (three) times daily as needed for cough. 05/08/16   Katy Apo, NP  loratadine-pseudoephedrine (CLARITIN-D 12 HOUR) 5-120 MG tablet Take 1 tablet by mouth 2 (two) times daily. 10/24/15   Frederich Cha, MD   Meds Ordered and Administered this Visit  Medications - No data to display  BP 131/86 (BP Location: Left Arm)   Pulse 83   Temp 98 F (36.7 C) (Oral)   Resp 16   Ht 6\' 2"  (1.88 m)   Wt 200 lb (90.7 kg)   SpO2 100%   BMI 25.68 kg/m  No data found.   Physical Exam  Constitutional: He is oriented to person, place, and time. He appears well-developed and well-nourished. He does not appear ill. No distress.  HENT:  Head: Normocephalic and atraumatic.  Right Ear: Hearing, tympanic membrane, external ear and ear canal normal.  Left Ear: Hearing, tympanic membrane, external ear and ear canal normal.  Nose: Rhinorrhea present. Right sinus exhibits no maxillary sinus tenderness and no frontal sinus tenderness. Left sinus exhibits no maxillary sinus tenderness and no frontal sinus tenderness.  Mouth/Throat: Uvula is midline and mucous membranes are normal. Posterior oropharyngeal erythema present.  Neck: Normal range of motion. Neck supple.  Cardiovascular: Normal rate, regular rhythm and normal heart sounds.   Pulmonary/Chest: Effort normal and breath sounds normal. No respiratory distress. He has no decreased breath sounds. He has no wheezes. He has no rhonchi.  Lymphadenopathy:    He has no cervical adenopathy.  Neurological: He is alert and oriented to person, place, and time.  Skin: Skin is warm and dry.  Psychiatric: He has a normal mood and affect. His behavior is normal. Judgment and thought content normal.    Urgent Care Course   Clinical  Course     Procedures (including critical care time)  Labs Review Labs Reviewed - No data to display  Imaging Review No results found.   Visual Acuity Review  Right Eye Distance:   Left Eye Distance:   Bilateral Distance:    Right Eye Near:   Left Eye Near:    Bilateral Near:         MDM   1. Viral respiratory illness    Discussed with patient that he probably has a viral illness similar to Influenza. Recommend start Benzonatate cough pills 1 every 8 hours as needed. Increase fluid intake to help loosen up mucus. Rest. May take Tylenol for body aches and any fever. Since he has multiple chronic health issues and his PCP is closed for the holidays, recommend if symptoms do not improve within 48 hours, start Augmentin twice a day as directed. Follow-up with his primary care provider in 4 to 5 days if not improving or go to ER if symptoms worsen.      Katy Apo, NP 05/08/16 1057

## 2016-05-13 ENCOUNTER — Other Ambulatory Visit
Admission: RE | Admit: 2016-05-13 | Discharge: 2016-05-13 | Disposition: A | Discharge status: Home / Self Care | Payer: Medicare HMO | Source: Ambulatory Visit | Attending: Internal Medicine | Admitting: Internal Medicine

## 2016-05-13 DIAGNOSIS — M25561 Pain in right knee: Secondary | ICD-10-CM | POA: Insufficient documentation

## 2016-05-13 LAB — SYNOVIAL CELL COUNT + DIFF, W/ CRYSTALS
Crystals, Fluid: NONE SEEN
Lymphocytes-Synovial Fld: 28 %
Monocyte-Macrophage-Synovial Fluid: 6 %
NEUTROPHIL, SYNOVIAL: 66 %
WBC, Synovial: 6100 /mm3 — ABNORMAL HIGH (ref 0–200)

## 2016-07-10 DIAGNOSIS — J8410 Pulmonary fibrosis, unspecified: Secondary | ICD-10-CM | POA: Insufficient documentation

## 2016-09-28 DIAGNOSIS — I872 Venous insufficiency (chronic) (peripheral): Secondary | ICD-10-CM | POA: Insufficient documentation

## 2017-04-04 ENCOUNTER — Other Ambulatory Visit: Payer: Self-pay

## 2017-04-04 ENCOUNTER — Ambulatory Visit
Admission: EM | Admit: 2017-04-04 | Discharge: 2017-04-04 | Disposition: A | Discharge status: Home / Self Care | Payer: Medicare HMO | Attending: Family Medicine | Admitting: Family Medicine

## 2017-04-04 DIAGNOSIS — H6121 Impacted cerumen, right ear: Secondary | ICD-10-CM

## 2017-04-04 DIAGNOSIS — R42 Dizziness and giddiness: Secondary | ICD-10-CM

## 2017-04-04 MED ORDER — MECLIZINE HCL 25 MG PO TABS: 25.0000 mg | ORAL_TABLET | Freq: Three times a day (TID) | ORAL | 0 refills | Status: DC | PRN

## 2017-04-04 NOTE — ED Triage Notes (Signed)
Patient is c/o hearing loss in right  Ear, balance problem, HA and dizziness x 4 days.

## 2017-04-04 NOTE — Discharge Instructions (Signed)
Call your doctor about the medications given your dizziness.  Stay hydrated.  Use the medication if needed.  Take care  Dr. Lacinda Axon

## 2017-04-04 NOTE — ED Provider Notes (Signed)
[facility]    CSN: [id number] Arrival date & time: 04/04/17  2951  History   Chief Complaint Chief Complaint  Patient presents with  . Dizziness   HPI  82 year old male presents with decreased hearing out of the right ear, dizziness/imbalance.  Patient states that his symptoms started on Thursday or Friday.  He reports that he is had essentially no hearing out of his right ear.  He states that he is also had dizziness which she describes as feeling off balance.  He states that this differs from his prior bouts of vertigo as things are "not spinning".  He does report that he has low blood pressure 1-2 times a week.  He is currently taking chlorthalidone as well as terazosin (which could be contributing to his dizziness).  However, he states that these medications are chronic and he has been on them for years.  No new medications.  No acute vision changes.  He states that his vision has been worse over the past 4-5 months.  No known exacerbating relieving factors.  No reports of focal weakness.  No other associated symptoms.  No other complaints at this time.  Past Medical History:  Diagnosis Date  . Arthritis    rheumatoid, osteoarthritis  . Chronic kidney disease    renal insufficiency  . Coronary artery disease   . Hypertension   . Hyperuricemia   . Hypothyroidism    Past Surgical History:  Procedure Laterality Date  . COLONOSCOPY    . CORONARY ARTERY BYPASS GRAFT    . ESOPHAGOGASTRODUODENOSCOPY    . GASTROC RECESSION EXTREMITY Left 07/05/2015   Performed by Albertine Patricia, DPM at [facility] ORS  . JOINT REPLACEMENT Right    hip  . OSTECTOMY 4th & 5th metatarsals left foot Left 07/05/2015   Performed by Albertine Patricia, DPM at [place] [facility]ations    Prior to Admission medications   Medication Sig Start Date End Date Taking? Authorizing Provider  acetaminophen (TYLENOL) 325 MG tablet Take 650 mg by mouth as needed.   Yes [provider]    aspirin 325 MG tablet Take 325 mg by mouth daily.   Yes [provider]  chlorthalidone (HYGROTON) 25 MG tablet Take 25 mg by mouth daily.   Yes [provider]  cyanocobalamin (,VITAMIN B-12,) 1000 MCG/ML injection Inject 1,000 mcg into the muscle every 30 (thirty) days.   Yes [provider]  hydroxychloroquine (PLAQUENIL) 200 MG tablet Take 200 mg by mouth daily.   Yes [provider]  levothyroxine (SYNTHROID, LEVOTHROID) 25 MCG tablet Take 25 mcg by mouth daily before breakfast.   Yes [provider]  omeprazole (PRILOSEC) 40 MG capsule Take 40 mg by mouth daily.   Yes [provider]  simvastatin (ZOCOR) 40 MG tablet Take 40 mg by mouth daily.   Yes [provider]  terazosin (HYTRIN) 2 MG capsule Take 2 mg by mouth at bedtime.   Yes [provider]  meclizine (ANTIVERT) 25 MG tablet Take 1 tablet (25 mg total) 3 (three) times daily as needed by mouth for dizziness. 04/04/17   Coral Spikes, DO   Family History Heart disease Father    Heart disease Mother     Social History Social History   Tobacco Use  . Smoking status: Former Smoker    Last attempt to quit: 06/23/1973    Years since quitting: 43.8  . Smokeless tobacco: Never Used  Substance Use Topics  .  Alcohol use: No  . Drug use: No   Allergies   Patient has no known allergies.  Review of Systems Review of Systems  HENT: Positive for hearing loss.   Musculoskeletal: Positive for gait problem.  Neurological: Positive for dizziness.   Physical Exam Triage Vital Signs ED Triage Vitals  Enc Vitals Group     BP 04/04/17 0953 136/78     Pulse Rate 04/04/17 0953 82     Resp --      Temp 04/04/17 0953 97.8 F (36.6 C)     Temp Source 04/04/17 0953 Oral     SpO2 04/04/17 0953 100 %     Weight 04/04/17 0954 190 lb (86.2 kg)     Height 04/04/17 0954 6\' 2"  (1.88 m)     Head Circumference --      Peak Flow --      Pain Score 04/04/17 0957 4      Pain Loc --      Pain Edu? --      Excl. in [place]? --    Updated Vital Signs BP 136/78 (BP Location: Left Arm)   Pulse 82   Temp 97.8 F (36.6 C) (Oral)   Ht 6\' 2"  (1.88 m)   Wt 190 lb (86.2 kg)   SpO2 100%   BMI 24.39 kg/m     Physical Exam  Constitutional: He is oriented to person, place, and time. He appears well-developed and well-nourished. No distress.  HENT:  Head: Normocephalic and atraumatic.  Mouth/Throat: Oropharynx is clear and moist.  Right TM obscured by cerumen. Left TM partially obscured by cerumen.  Eyes: Conjunctivae are normal. Pupils are equal, round, and reactive to light.  Neck: Neck supple.  Cardiovascular: Normal rate and regular rhythm.  2/6 systolic murmur.   Pulmonary/Chest: Effort normal and breath sounds normal. He has no wheezes. He has no rales.  Lymphadenopathy:    He has no cervical adenopathy.  Neurological: He is alert and oriented to person, place, and time. No cranial nerve deficit. He exhibits normal muscle tone.  Negative Rhomberg.  Skin: Skin is warm. No rash noted.  Psychiatric: He has a normal mood and affect. His behavior is normal. Thought content normal.  Vitals reviewed.  UC Treatments / Results  Labs (all labs ordered are listed, but only abnormal results are displayed) Labs Reviewed - No data to display  EKG  EKG Interpretation None       Radiology No results found.  Procedures Procedures (including critical care time)  Medications Ordered in UC Medications - No data to display   Initial Impression / Assessment and Plan / UC Course  I have reviewed the triage vital signs and the nursing notes.  Pertinent labs & imaging results that were available during my care of the patient were reviewed by me and considered in my medical decision making (see chart for details).     82 year old male presents with cerumen impaction and dizziness which he describes as disequilibrium.  Cerumen impaction resolved with  irrigation.  I feel like his dizziness is impacted by several things.  The cerumen impaction likely contributing.  Medication likely also contribute them.  I advised him to talk to his physician about chlorthalidone and terazosin.  Meclizine if needed.  No apparent deficits on exam to suggest that he needs neuroimaging.  Final Clinical Impressions(s) / UC Diagnoses   Final diagnoses:  Dizziness  Impacted cerumen of right ear   ED Discharge Orders  Ordered    meclizine (ANTIVERT) 25 MG tablet  3 times daily PRN     04/04/17 1035     Controlled Substance Prescriptions [place] Controlled Substance Registry consulted? Not Applicable   Coral Spikes, DO 04/04/17 1039

## 2017-04-06 DIAGNOSIS — R270 Ataxia, unspecified: Secondary | ICD-10-CM | POA: Insufficient documentation

## 2017-05-24 DIAGNOSIS — R0602 Shortness of breath: Secondary | ICD-10-CM | POA: Insufficient documentation

## 2017-08-31 ENCOUNTER — Encounter: Payer: Self-pay | Admitting: *Deleted

## 2017-08-31 ENCOUNTER — Other Ambulatory Visit: Payer: Self-pay

## 2017-09-06 NOTE — Discharge Instructions (Signed)

## 2017-09-08 ENCOUNTER — Encounter
Admission: RE | Disposition: A | Discharge status: Home / Self Care | Payer: Self-pay | Source: Ambulatory Visit | Attending: Ophthalmology

## 2017-09-08 ENCOUNTER — Ambulatory Visit
Admission: RE | Admit: 2017-09-08 | Discharge: 2017-09-08 | Disposition: A | Discharge status: Home / Self Care | Payer: Medicare HMO | Source: Ambulatory Visit | Attending: Ophthalmology | Admitting: Ophthalmology

## 2017-09-08 ENCOUNTER — Ambulatory Visit: Payer: Medicare HMO | Admitting: Anesthesiology

## 2017-09-08 DIAGNOSIS — Z96649 Presence of unspecified artificial hip joint: Secondary | ICD-10-CM | POA: Insufficient documentation

## 2017-09-08 DIAGNOSIS — Z951 Presence of aortocoronary bypass graft: Secondary | ICD-10-CM | POA: Diagnosis not present

## 2017-09-08 DIAGNOSIS — I10 Essential (primary) hypertension: Secondary | ICD-10-CM | POA: Insufficient documentation

## 2017-09-08 DIAGNOSIS — I2510 Atherosclerotic heart disease of native coronary artery without angina pectoris: Secondary | ICD-10-CM | POA: Insufficient documentation

## 2017-09-08 DIAGNOSIS — Z9989 Dependence on other enabling machines and devices: Secondary | ICD-10-CM | POA: Insufficient documentation

## 2017-09-08 DIAGNOSIS — K219 Gastro-esophageal reflux disease without esophagitis: Secondary | ICD-10-CM | POA: Diagnosis not present

## 2017-09-08 DIAGNOSIS — H2511 Age-related nuclear cataract, right eye: Secondary | ICD-10-CM | POA: Diagnosis present

## 2017-09-08 DIAGNOSIS — Z79899 Other long term (current) drug therapy: Secondary | ICD-10-CM | POA: Diagnosis not present

## 2017-09-08 DIAGNOSIS — G4730 Sleep apnea, unspecified: Secondary | ICD-10-CM | POA: Diagnosis not present

## 2017-09-08 DIAGNOSIS — Z7952 Long term (current) use of systemic steroids: Secondary | ICD-10-CM | POA: Insufficient documentation

## 2017-09-08 DIAGNOSIS — Z87891 Personal history of nicotine dependence: Secondary | ICD-10-CM | POA: Insufficient documentation

## 2017-09-08 HISTORY — DX: Interstitial pulmonary disease, unspecified: J84.9

## 2017-09-08 HISTORY — DX: Sleep apnea, unspecified: G47.30

## 2017-09-08 HISTORY — DX: Gastro-esophageal reflux disease without esophagitis: K21.9

## 2017-09-08 HISTORY — PX: CATARACT EXTRACTION W/PHACO: SHX586

## 2017-09-08 HISTORY — DX: Presence of dental prosthetic device (complete) (partial): Z97.2

## 2017-09-08 SURGERY — PHACOEMULSIFICATION, CATARACT, WITH IOL INSERTION
Anesthesia: Monitor Anesthesia Care | Site: Eye | Laterality: Right | Wound class: "Clean "

## 2017-09-08 MED ORDER — MIDAZOLAM HCL 2 MG/2ML IJ SOLN
INTRAMUSCULAR | Status: DC | PRN
  Administered 2017-09-08: 1 mg via INTRAVENOUS

## 2017-09-08 MED ORDER — EPINEPHRINE PF 1 MG/ML IJ SOLN
INTRAMUSCULAR | Status: DC | PRN
  Administered 2017-09-08: 73 mL via OPHTHALMIC

## 2017-09-08 MED ORDER — FENTANYL CITRATE (PF) 100 MCG/2ML IJ SOLN
INTRAMUSCULAR | Status: DC | PRN
  Administered 2017-09-08: 50 ug via INTRAVENOUS

## 2017-09-08 MED ORDER — ARMC OPHTHALMIC DILATING DROPS
1.0000 "application " | OPHTHALMIC | Status: DC | PRN
  Administered 2017-09-08 (×3): 1 via OPHTHALMIC

## 2017-09-08 MED ORDER — NA HYALUR & NA CHOND-NA HYALUR 0.4-0.35 ML IO KIT
PACK | INTRAOCULAR | Status: DC | PRN
  Administered 2017-09-08: 1 mL via INTRAOCULAR

## 2017-09-08 MED ORDER — ACETAMINOPHEN 160 MG/5ML PO SOLN: 325.0000 mg | Freq: Once | ORAL | Status: DC

## 2017-09-08 MED ORDER — CEFUROXIME OPHTHALMIC INJECTION 1 MG/0.1 ML
INJECTION | OPHTHALMIC | Status: DC | PRN
  Administered 2017-09-08: 0.1 mL via INTRACAMERAL

## 2017-09-08 MED ORDER — BRIMONIDINE TARTRATE-TIMOLOL 0.2-0.5 % OP SOLN
OPHTHALMIC | Status: DC | PRN
  Administered 2017-09-08: 1 [drp] via OPHTHALMIC

## 2017-09-08 MED ORDER — LACTATED RINGERS IV SOLN: INTRAVENOUS | Status: DC

## 2017-09-08 MED ORDER — BALANCED SALT IO SOLN
INTRAOCULAR | Status: DC | PRN
  Administered 2017-09-08: 1 mL

## 2017-09-08 MED ORDER — ACETAMINOPHEN 325 MG PO TABS: 325.0000 mg | ORAL_TABLET | Freq: Once | ORAL | Status: DC

## 2017-09-08 MED ORDER — MOXIFLOXACIN HCL 0.5 % OP SOLN
1.0000 [drp] | OPHTHALMIC | Status: DC | PRN
  Administered 2017-09-08 (×3): 1 [drp] via OPHTHALMIC

## 2017-09-08 SURGICAL SUPPLY — 27 items
CANNULA ANT/CHMB 27G (MISCELLANEOUS) ×1 IMPLANT
CANNULA ANT/CHMB 27GA (MISCELLANEOUS) ×3 IMPLANT
CARTRIDGE ABBOTT (MISCELLANEOUS) IMPLANT
GLOVE SURG LX 7.5 STRW (GLOVE) ×2
GLOVE SURG LX STRL 7.5 STRW (GLOVE) ×1 IMPLANT
GLOVE SURG TRIUMPH 8.0 PF LTX (GLOVE) ×3 IMPLANT
GOWN STRL REUS W/ TWL LRG LVL3 (GOWN DISPOSABLE) ×2 IMPLANT
GOWN STRL REUS W/TWL LRG LVL3 (GOWN DISPOSABLE) ×4
LENS IOL TECNIS ITEC 19.5 (Intraocular Lens) ×2 IMPLANT
MARKER SKIN DUAL TIP RULER LAB (MISCELLANEOUS) ×3 IMPLANT
NDL FILTER BLUNT 18X1 1/2 (NEEDLE) ×1 IMPLANT
NDL RETROBULBAR .5 NSTRL (NEEDLE) IMPLANT
NEEDLE FILTER BLUNT 18X 1/2SAF (NEEDLE) ×2
NEEDLE FILTER BLUNT 18X1 1/2 (NEEDLE) ×1 IMPLANT
PACK CATARACT BRASINGTON (MISCELLANEOUS) ×3 IMPLANT
PACK EYE AFTER SURG (MISCELLANEOUS) ×3 IMPLANT
PACK OPTHALMIC (MISCELLANEOUS) ×3 IMPLANT
RING MALYGIN 7.0 (MISCELLANEOUS) IMPLANT
SUT ETHILON 10-0 CS-B-6CS-B-6 (SUTURE)
SUT VICRYL  9 0 (SUTURE)
SUT VICRYL 9 0 (SUTURE) IMPLANT
SUTURE EHLN 10-0 CS-B-6CS-B-6 (SUTURE) IMPLANT
SYR 3ML LL SCALE MARK (SYRINGE) ×3 IMPLANT
SYR 5ML LL (SYRINGE) ×3 IMPLANT
SYR TB 1ML LUER SLIP (SYRINGE) ×3 IMPLANT
WATER STERILE IRR 500ML POUR (IV SOLUTION) ×3 IMPLANT
WIPE NON LINTING 3.25X3.25 (MISCELLANEOUS) ×3 IMPLANT

## 2017-09-08 NOTE — Transfer of Care (Signed)
Immediate Anesthesia Transfer of Care Note  Patient: Edwin Christensen  Procedure(s) Performed: CATARACT EXTRACTION PHACO AND INTRAOCULAR LENS PLACEMENT (IOC) RIGHT (Right Eye)  Patient Location: PACU  Anesthesia Type: MAC  Level of Consciousness: awake, alert  and patient cooperative  Airway and Oxygen Therapy: Patient Spontanous Breathing and Patient connected to supplemental oxygen  Post-op Assessment: Post-op Vital signs reviewed, Patient's Cardiovascular Status Stable, Respiratory Function Stable, Patent Airway and No signs of Nausea or vomiting  Post-op Vital Signs: Reviewed and stable  Complications: No apparent anesthesia complications

## 2017-09-08 NOTE — H&P (Signed)
The History and Physical notes are on paper, have been signed, and are to be scanned. The patient remains stable and unchanged from the H&P.   Previous H&P reviewed, patient examined, and there are no changes.  Kennie Snedden 09/08/2017 8:09 AM

## 2017-09-08 NOTE — Anesthesia Procedure Notes (Signed)
Procedure Name: MAC Performed by: Lind Guest, CRNA Pre-anesthesia Checklist: Patient identified, Emergency Drugs available, Suction available, Patient being monitored and Timeout performed Patient Re-evaluated:Patient Re-evaluated prior to induction Oxygen Delivery Method: Nasal cannula

## 2017-09-08 NOTE — Anesthesia Preprocedure Evaluation (Signed)
Anesthesia Evaluation  Patient identified by MRN, date of birth, ID band Patient awake    Reviewed: Allergy & Precautions, H&P , NPO status , Patient's Chart, lab work & pertinent test results  Airway Mallampati: II  TM Distance: >3 FB Neck ROM: full    Dental  (+) Upper Dentures, Partial Lower   Pulmonary sleep apnea , former smoker,    Pulmonary exam normal breath sounds clear to auscultation       Cardiovascular hypertension, + CAD  Normal cardiovascular exam Rhythm:regular Rate:Normal     Neuro/Psych    GI/Hepatic   Endo/Other  Hypothyroidism   Renal/GU Renal disease     Musculoskeletal   Abdominal   Peds  Hematology   Anesthesia Other Findings   Reproductive/Obstetrics                             Anesthesia Physical Anesthesia Plan  ASA: III  Anesthesia Plan: MAC   Post-op Pain Management:    Induction:   PONV Risk Score and Plan: 1 and Treatment may vary due to age or medical condition  Airway Management Planned:   Additional Equipment:   Intra-op Plan:   Post-operative Plan:   Informed Consent: I have reviewed the patients History and Physical, chart, labs and discussed the procedure including the risks, benefits and alternatives for the proposed anesthesia with the patient or authorized representative who has indicated his/her understanding and acceptance.     Plan Discussed with: CRNA  Anesthesia Plan Comments:         Anesthesia Quick Evaluation

## 2017-09-08 NOTE — Anesthesia Postprocedure Evaluation (Signed)
Anesthesia Post Note  Patient: Edwin Christensen  Procedure(s) Performed: CATARACT EXTRACTION PHACO AND INTRAOCULAR LENS PLACEMENT (IOC) RIGHT (Right Eye)  Patient location during evaluation: PACU Anesthesia Type: MAC Level of consciousness: awake and alert and oriented Pain management: satisfactory to patient Vital Signs Assessment: post-procedure vital signs reviewed and stable Respiratory status: spontaneous breathing, nonlabored ventilation and respiratory function stable Cardiovascular status: blood pressure returned to baseline and stable Postop Assessment: Adequate PO intake and No signs of nausea or vomiting Anesthetic complications: no    Raliegh Ip

## 2017-09-08 NOTE — Op Note (Signed)
LOCATION:  [place]   PREOPERATIVE DIAGNOSIS:    Nuclear sclerotic cataract right eye. H25.11   POSTOPERATIVE DIAGNOSIS:  Nuclear sclerotic cataract right eye.     PROCEDURE:  Phacoemusification with posterior chamber intraocular lens placement of the right eye   LENS:   Implant Name Type Inv. Item Serial No. Manufacturer Lot No. LRB No. Used  LENS IOL DIOP 19.5 - [id number] Intraocular Lens LENS IOL DIOP 19.5 [id number]  Right 1        ULTRASOUND TIME: 12 % of 1 minutes, 9 seconds.  CDE 8.4   SURGEON:  Wyonia Hough, MD   ANESTHESIA:  Topical with tetracaine drops and 2% Xylocaine jelly, augmented with 1% preservative-free intracameral lidocaine.    COMPLICATIONS:  None.   DESCRIPTION OF PROCEDURE:  The patient was identified in the holding room and transported to the operating room and placed in the supine position under the operating microscope.  The right eye was identified as the operative eye and it was prepped and draped in the usual sterile ophthalmic fashion.   A 1 millimeter clear-corneal paracentesis was made at the 12:00 position.  0.5 ml of preservative-free 1% lidocaine was injected into the anterior chamber. The anterior chamber was filled with Viscoat viscoelastic.  A 2.4 millimeter keratome was used to make a near-clear corneal incision at the 9:00 position.  A curvilinear capsulorrhexis was made with a cystotome and capsulorrhexis forceps.  Balanced salt solution was used to hydrodissect and hydrodelineate the nucleus.   Phacoemulsification was then used in stop and chop fashion to remove the lens nucleus and epinucleus.  The remaining cortex was then removed using the irrigation and aspiration handpiece. Provisc was then placed into the capsular bag to distend it for lens placement.  A lens was then injected into the capsular bag.  The remaining viscoelastic was aspirated.   Wounds were hydrated with balanced salt solution.  The anterior  chamber was inflated to a physiologic pressure with balanced salt solution.  No wound leaks were noted. Cefuroxime 0.1 ml of a 10mg /ml solution was injected into the anterior chamber for a dose of 1 mg of intracameral antibiotic at the completion of the case.   Timolol and Brimonidine drops were applied to the eye.  The patient was taken to the recovery room in stable condition without complications of anesthesia or surgery.   Roderic Lammert 09/08/2017, 9:00 AM

## 2017-09-09 ENCOUNTER — Encounter: Payer: Self-pay | Admitting: Ophthalmology

## 2017-09-30 NOTE — Discharge Instructions (Signed)

## 2017-10-06 ENCOUNTER — Encounter
Admission: RE | Disposition: A | Discharge status: Home / Self Care | Payer: Self-pay | Source: Ambulatory Visit | Attending: Ophthalmology

## 2017-10-06 ENCOUNTER — Ambulatory Visit
Admission: RE | Admit: 2017-10-06 | Discharge: 2017-10-06 | Disposition: A | Discharge status: Home / Self Care | Payer: Medicare HMO | Source: Ambulatory Visit | Attending: Ophthalmology | Admitting: Ophthalmology

## 2017-10-06 ENCOUNTER — Ambulatory Visit: Payer: Medicare HMO | Admitting: Anesthesiology

## 2017-10-06 DIAGNOSIS — G4730 Sleep apnea, unspecified: Secondary | ICD-10-CM | POA: Insufficient documentation

## 2017-10-06 DIAGNOSIS — I2510 Atherosclerotic heart disease of native coronary artery without angina pectoris: Secondary | ICD-10-CM | POA: Diagnosis not present

## 2017-10-06 DIAGNOSIS — I10 Essential (primary) hypertension: Secondary | ICD-10-CM | POA: Insufficient documentation

## 2017-10-06 DIAGNOSIS — E039 Hypothyroidism, unspecified: Secondary | ICD-10-CM | POA: Insufficient documentation

## 2017-10-06 DIAGNOSIS — H2512 Age-related nuclear cataract, left eye: Secondary | ICD-10-CM | POA: Insufficient documentation

## 2017-10-06 DIAGNOSIS — Z87891 Personal history of nicotine dependence: Secondary | ICD-10-CM | POA: Diagnosis not present

## 2017-10-06 DIAGNOSIS — Z9989 Dependence on other enabling machines and devices: Secondary | ICD-10-CM | POA: Insufficient documentation

## 2017-10-06 DIAGNOSIS — M069 Rheumatoid arthritis, unspecified: Secondary | ICD-10-CM | POA: Diagnosis not present

## 2017-10-06 DIAGNOSIS — E538 Deficiency of other specified B group vitamins: Secondary | ICD-10-CM | POA: Insufficient documentation

## 2017-10-06 HISTORY — PX: CATARACT EXTRACTION W/PHACO: SHX586

## 2017-10-06 SURGERY — PHACOEMULSIFICATION, CATARACT, WITH IOL INSERTION
Anesthesia: Monitor Anesthesia Care | Site: Eye | Laterality: Left | Wound class: "Clean "

## 2017-10-06 MED ORDER — ACETAMINOPHEN 160 MG/5ML PO SOLN: 325.0000 mg | ORAL | Status: DC | PRN

## 2017-10-06 MED ORDER — BRIMONIDINE TARTRATE-TIMOLOL 0.2-0.5 % OP SOLN
OPHTHALMIC | Status: DC | PRN
  Administered 2017-10-06: 1 [drp] via OPHTHALMIC

## 2017-10-06 MED ORDER — MOXIFLOXACIN HCL 0.5 % OP SOLN
1.0000 [drp] | OPHTHALMIC | Status: DC | PRN
  Administered 2017-10-06 (×3): 1 [drp] via OPHTHALMIC

## 2017-10-06 MED ORDER — ONDANSETRON HCL 4 MG/2ML IJ SOLN: 4.0000 mg | Freq: Once | INTRAMUSCULAR | Status: DC | PRN

## 2017-10-06 MED ORDER — CEFUROXIME OPHTHALMIC INJECTION 1 MG/0.1 ML
INJECTION | OPHTHALMIC | Status: DC | PRN
  Administered 2017-10-06: .3 mL via OPHTHALMIC

## 2017-10-06 MED ORDER — ACETAMINOPHEN 325 MG PO TABS: 325.0000 mg | ORAL_TABLET | ORAL | Status: DC | PRN

## 2017-10-06 MED ORDER — LACTATED RINGERS IV SOLN: 10.0000 mL/h | INTRAVENOUS | Status: DC

## 2017-10-06 MED ORDER — MIDAZOLAM HCL 2 MG/2ML IJ SOLN
INTRAMUSCULAR | Status: DC | PRN
  Administered 2017-10-06: 1 mg via INTRAVENOUS

## 2017-10-06 MED ORDER — FENTANYL CITRATE (PF) 100 MCG/2ML IJ SOLN
INTRAMUSCULAR | Status: DC | PRN
  Administered 2017-10-06: 50 ug via INTRAVENOUS

## 2017-10-06 MED ORDER — ARMC OPHTHALMIC DILATING DROPS
1.0000 "application " | OPHTHALMIC | Status: DC | PRN
  Administered 2017-10-06 (×3): 1 via OPHTHALMIC

## 2017-10-06 MED ORDER — BSS IO SOLN
INTRAOCULAR | Status: DC | PRN
  Administered 2017-10-06: 64 mL via OPHTHALMIC

## 2017-10-06 MED ORDER — NA HYALUR & NA CHOND-NA HYALUR 0.4-0.35 ML IO KIT
PACK | INTRAOCULAR | Status: DC | PRN
  Administered 2017-10-06: 1 mL via INTRAOCULAR

## 2017-10-06 MED ORDER — LIDOCAINE HCL (PF) 2 % IJ SOLN
INTRAOCULAR | Status: DC | PRN
  Administered 2017-10-06: 1 mL via INTRAMUSCULAR

## 2017-10-06 SURGICAL SUPPLY — 27 items
CANNULA ANT/CHMB 27G (MISCELLANEOUS) ×1 IMPLANT
CANNULA ANT/CHMB 27GA (MISCELLANEOUS) ×3 IMPLANT
CARTRIDGE ABBOTT (MISCELLANEOUS) IMPLANT
GLOVE SURG LX 7.5 STRW (GLOVE) ×2
GLOVE SURG LX STRL 7.5 STRW (GLOVE) ×1 IMPLANT
GLOVE SURG TRIUMPH 8.0 PF LTX (GLOVE) ×3 IMPLANT
GOWN STRL REUS W/ TWL LRG LVL3 (GOWN DISPOSABLE) ×2 IMPLANT
GOWN STRL REUS W/TWL LRG LVL3 (GOWN DISPOSABLE) ×4
LENS IOL TECNIS ITEC 19.5 (Intraocular Lens) ×2 IMPLANT
MARKER SKIN DUAL TIP RULER LAB (MISCELLANEOUS) ×3 IMPLANT
NDL FILTER BLUNT 18X1 1/2 (NEEDLE) ×1 IMPLANT
NDL RETROBULBAR .5 NSTRL (NEEDLE) IMPLANT
NEEDLE FILTER BLUNT 18X 1/2SAF (NEEDLE) ×2
NEEDLE FILTER BLUNT 18X1 1/2 (NEEDLE) ×1 IMPLANT
PACK CATARACT BRASINGTON (MISCELLANEOUS) ×3 IMPLANT
PACK EYE AFTER SURG (MISCELLANEOUS) ×3 IMPLANT
PACK OPTHALMIC (MISCELLANEOUS) ×3 IMPLANT
RING MALYGIN 7.0 (MISCELLANEOUS) IMPLANT
SUT ETHILON 10-0 CS-B-6CS-B-6 (SUTURE)
SUT VICRYL  9 0 (SUTURE)
SUT VICRYL 9 0 (SUTURE) IMPLANT
SUTURE EHLN 10-0 CS-B-6CS-B-6 (SUTURE) IMPLANT
SYR 3ML LL SCALE MARK (SYRINGE) ×3 IMPLANT
SYR 5ML LL (SYRINGE) ×3 IMPLANT
SYR TB 1ML LUER SLIP (SYRINGE) ×3 IMPLANT
WATER STERILE IRR 500ML POUR (IV SOLUTION) ×3 IMPLANT
WIPE NON LINTING 3.25X3.25 (MISCELLANEOUS) ×3 IMPLANT

## 2017-10-06 NOTE — Anesthesia Preprocedure Evaluation (Signed)
Anesthesia Evaluation  Patient identified by MRN, date of birth, ID band Patient awake    Reviewed: Allergy & Precautions, H&P , NPO status , Patient's Chart, lab work & pertinent test results  Airway Mallampati: III  TM Distance: >3 FB Neck ROM: full    Dental  (+) Upper Dentures, Partial Lower   Pulmonary sleep apnea and Continuous Positive Airway Pressure Ventilation , former smoker (quit 1975),    Pulmonary exam normal breath sounds clear to auscultation       Cardiovascular Exercise Tolerance: Good hypertension, + CAD  Normal cardiovascular exam Rhythm:Regular Rate:Normal     Neuro/Psych negative neurological ROS     GI/Hepatic negative GI ROS,   Endo/Other  Hypothyroidism   Renal/GU Renal disease     Musculoskeletal   Abdominal   Peds  Hematology negative hematology ROS (+)   Anesthesia Other Findings   Reproductive/Obstetrics                             Anesthesia Physical  Anesthesia Plan  ASA: III  Anesthesia Plan: MAC   Post-op Pain Management:    Induction: Intravenous  PONV Risk Score and Plan: 1 and Treatment may vary due to age or medical condition, TIVA and Midazolam  Airway Management Planned: Natural Airway  Additional Equipment:   Intra-op Plan:   Post-operative Plan:   Informed Consent: I have reviewed the patients History and Physical, chart, labs and discussed the procedure including the risks, benefits and alternatives for the proposed anesthesia with the patient or authorized representative who has indicated his/her understanding and acceptance.     Plan Discussed with: CRNA  Anesthesia Plan Comments:         Anesthesia Quick Evaluation

## 2017-10-06 NOTE — Op Note (Signed)
OPERATIVE NOTE  Edwin Christensen [id number] 10/06/2017   PREOPERATIVE DIAGNOSIS:  Nuclear sclerotic cataract left eye. H25.12   POSTOPERATIVE DIAGNOSIS:    Nuclear sclerotic cataract left eye.     PROCEDURE:  Phacoemusification with posterior chamber intraocular lens placement of the left eye   LENS:   Implant Name Type Inv. Item Serial No. Manufacturer Lot No. LRB No. Used  LENS IOL DIOP 19.5 - [id number] Intraocular Lens LENS IOL DIOP 19.5 [id number]  Left 1        ULTRASOUND TIME: 17  % of 1 minutes 20 seconds, CDE 13.8  SURGEON:  Wyonia Hough, MD   ANESTHESIA:  Topical with tetracaine drops and 2% Xylocaine jelly, augmented with 1% preservative-free intracameral lidocaine.    COMPLICATIONS:  None.   DESCRIPTION OF PROCEDURE:  The patient was identified in the holding room and transported to the operating room and placed in the supine position under the operating microscope.  The left eye was identified as the operative eye and it was prepped and draped in the usual sterile ophthalmic fashion.   A 1 millimeter clear-corneal paracentesis was made at the 1:30 position.  0.5 ml of preservative-free 1% lidocaine was injected into the anterior chamber.  The anterior chamber was filled with Viscoat viscoelastic.  A 2.4 millimeter keratome was used to make a near-clear corneal incision at the 10:30 position.  .  A curvilinear capsulorrhexis was made with a cystotome and capsulorrhexis forceps.  Balanced salt solution was used to hydrodissect and hydrodelineate the nucleus.   Phacoemulsification was then used in stop and chop fashion to remove the lens nucleus and epinucleus.  The remaining cortex was then removed using the irrigation and aspiration handpiece. Provisc was then placed into the capsular bag to distend it for lens placement.  A lens was then injected into the capsular bag.  The remaining viscoelastic was aspirated.   Wounds were hydrated with balanced salt  solution.  The anterior chamber was inflated to a physiologic pressure with balanced salt solution.  No wound leaks were noted. Cefuroxime 0.1 ml of a 10mg /ml solution was injected into the anterior chamber for a dose of 1 mg of intracameral antibiotic at the completion of the case.   Timolol and Brimonidine drops were applied to the eye.  The patient was taken to the recovery room in stable condition without complications of anesthesia or surgery.  Edwin Christensen 10/06/2017, 10:49 AM

## 2017-10-06 NOTE — H&P (Signed)
The History and Physical notes are on paper, have been signed, and are to be scanned. The patient remains stable and unchanged from the H&P.   Previous H&P reviewed, patient examined, and there are no changes.  Edwin Christensen 10/06/2017 9:59 AM

## 2017-10-06 NOTE — Anesthesia Postprocedure Evaluation (Signed)
Anesthesia Post Note  Patient: Edwin Christensen  Procedure(s) Performed: CATARACT EXTRACTION PHACO AND INTRAOCULAR LENS PLACEMENT (IOC) LEFT (Left Eye)  Patient location during evaluation: PACU Anesthesia Type: MAC Level of consciousness: awake and alert, oriented and patient cooperative Pain management: pain level controlled Vital Signs Assessment: post-procedure vital signs reviewed and stable Respiratory status: spontaneous breathing, nonlabored ventilation and respiratory function stable Cardiovascular status: blood pressure returned to baseline and stable Postop Assessment: adequate PO intake Anesthetic complications: no    Darrin Nipper

## 2017-10-06 NOTE — Anesthesia Procedure Notes (Signed)
Procedure Name: MAC Date/Time: 10/06/2017 10:31 AM Performed by: Janna Arch, CRNA Pre-anesthesia Checklist: Patient identified, Emergency Drugs available, Suction available and Patient being monitored Patient Re-evaluated:Patient Re-evaluated prior to induction Oxygen Delivery Method: Nasal cannula

## 2017-10-06 NOTE — Transfer of Care (Signed)
Immediate Anesthesia Transfer of Care Note  Patient: Edwin Christensen  Procedure(s) Performed: CATARACT EXTRACTION PHACO AND INTRAOCULAR LENS PLACEMENT (IOC) LEFT (Left Eye)  Patient Location: PACU  Anesthesia Type: MAC  Level of Consciousness: awake, alert  and patient cooperative  Airway and Oxygen Therapy: Patient Spontanous Breathing and Patient connected to supplemental oxygen  Post-op Assessment: Post-op Vital signs reviewed, Patient's Cardiovascular Status Stable, Respiratory Function Stable, Patent Airway and No signs of Nausea or vomiting  Post-op Vital Signs: Reviewed and stable  Complications: No apparent anesthesia complications

## 2017-10-07 ENCOUNTER — Encounter: Payer: Self-pay | Admitting: Ophthalmology

## 2017-11-01 DIAGNOSIS — N471 Phimosis: Secondary | ICD-10-CM | POA: Insufficient documentation

## 2018-04-25 ENCOUNTER — Emergency Department: Payer: Medicare HMO

## 2018-04-25 ENCOUNTER — Other Ambulatory Visit: Payer: Self-pay

## 2018-04-25 ENCOUNTER — Encounter: Payer: Self-pay | Admitting: Emergency Medicine

## 2018-04-25 ENCOUNTER — Emergency Department
Admission: EM | Admit: 2018-04-25 | Discharge: 2018-04-25 | Disposition: A | Discharge status: Home / Self Care | Payer: Medicare HMO | Attending: Student in an Organized Health Care Education/Training Program | Admitting: Student in an Organized Health Care Education/Training Program

## 2018-04-25 DIAGNOSIS — S62304A Unspecified fracture of fourth metacarpal bone, right hand, initial encounter for closed fracture: Secondary | ICD-10-CM | POA: Insufficient documentation

## 2018-04-25 DIAGNOSIS — Y929 Unspecified place or not applicable: Secondary | ICD-10-CM | POA: Diagnosis not present

## 2018-04-25 DIAGNOSIS — Z79899 Other long term (current) drug therapy: Secondary | ICD-10-CM | POA: Insufficient documentation

## 2018-04-25 DIAGNOSIS — S6982XA Other specified injuries of left wrist, hand and finger(s), initial encounter: Secondary | ICD-10-CM | POA: Diagnosis present

## 2018-04-25 DIAGNOSIS — N189 Chronic kidney disease, unspecified: Secondary | ICD-10-CM | POA: Diagnosis not present

## 2018-04-25 DIAGNOSIS — I2510 Atherosclerotic heart disease of native coronary artery without angina pectoris: Secondary | ICD-10-CM | POA: Diagnosis not present

## 2018-04-25 DIAGNOSIS — I129 Hypertensive chronic kidney disease with stage 1 through stage 4 chronic kidney disease, or unspecified chronic kidney disease: Secondary | ICD-10-CM | POA: Diagnosis not present

## 2018-04-25 DIAGNOSIS — Z87891 Personal history of nicotine dependence: Secondary | ICD-10-CM | POA: Diagnosis not present

## 2018-04-25 DIAGNOSIS — S62302A Unspecified fracture of third metacarpal bone, right hand, initial encounter for closed fracture: Secondary | ICD-10-CM | POA: Diagnosis not present

## 2018-04-25 DIAGNOSIS — Y999 Unspecified external cause status: Secondary | ICD-10-CM | POA: Insufficient documentation

## 2018-04-25 DIAGNOSIS — W0110XA Fall on same level from slipping, tripping and stumbling with subsequent striking against unspecified object, initial encounter: Secondary | ICD-10-CM | POA: Diagnosis not present

## 2018-04-25 DIAGNOSIS — Y939 Activity, unspecified: Secondary | ICD-10-CM | POA: Insufficient documentation

## 2018-04-25 DIAGNOSIS — M069 Rheumatoid arthritis, unspecified: Secondary | ICD-10-CM | POA: Insufficient documentation

## 2018-04-25 NOTE — ED Triage Notes (Signed)
Tripped and fell 11am today, pain L hand.

## 2018-04-25 NOTE — Discharge Instructions (Addendum)
Follow-up with Dr. Truitt Leep.  Please call his office for an appointment.  Leave the splint in place until released by orthopedics.  Take Tylenol for pain as needed.  If you develop a headache or have any neurologic symptoms please return to the emergency department.

## 2018-04-25 NOTE — ED Provider Notes (Signed)
[facility] Emergency Department Provider Note  ____________________________________________   First MD Initiated Contact with Patient 04/25/18 1426     (approximate)  I have reviewed the triage vital signs and the nursing notes.   HISTORY  Chief Complaint Hand Pain    HPI Edwin Christensen is a 83 y.o. male presents emergency department complaining of left hand pain.  States he tripped over a Biomedical engineer in the yard and landed on his hand.  He is denying any other injuries.  No head injury no use of blood thinners.  He denies neck pain back pain wrist pain or shoulder pain.  He states he has been ambulatory since the fall.    Past Medical History:  Diagnosis Date  . Arthritis    rheumatoid, osteoarthritis  . Chronic kidney disease    renal insufficiency  . Coronary artery disease   . GERD (gastroesophageal reflux disease)   . Hypertension   . Hyperuricemia   . Hypothyroidism   . Interstitial lung disease ([place])   . Sleep apnea    mld - CPAP  . Wears dentures    full upper, partial lower    There are no active problems to display for this patient.   Past Surgical History:  Procedure Laterality Date  . CATARACT EXTRACTION W/PHACO Right 09/08/2017   Procedure: CATARACT EXTRACTION PHACO AND INTRAOCULAR LENS PLACEMENT ([place]) RIGHT;  Surgeon: Leandrew Koyanagi, MD;  Location: [place];  Service: Ophthalmology;  Laterality: Right;  sleep apnea  . CATARACT EXTRACTION W/PHACO Left 10/06/2017   Procedure: CATARACT EXTRACTION PHACO AND INTRAOCULAR LENS PLACEMENT ([place]) LEFT;  Surgeon: Leandrew Koyanagi, MD;  Location: [place];  Service: Ophthalmology;  Laterality: Left;  . CERVICAL FUSION    . COLONOSCOPY    . CORONARY ARTERY BYPASS GRAFT  1992  . ESOPHAGOGASTRODUODENOSCOPY    . GASTROC RECESSION EXTREMITY Left 07/05/2015   Procedure: GASTROC RECESSION EXTREMITY;  Surgeon: Albertine Patricia, DPM;  Location: [facility];   Service: Podiatry;  Laterality: Left;  LMA w/ local  . JOINT REPLACEMENT Right    hip  . OSTECTOMY Left 07/05/2015   Procedure: OSTECTOMY 4th & 5th metatarsals left foot;  Surgeon: Albertine Patricia, DPM;  Location: [facility];  Service: Podiatry;  Laterality: Left;  LMA w/ local    Prior to Admission medications   Medication Sig Start Date End Date Taking? Authorizing Provider  acetaminophen (TYLENOL) 325 MG tablet Take 650 mg by mouth as needed.    [provider]  aspirin 325 MG tablet Take 325 mg by mouth daily.    [provider]  azaTHIOprine (IMURAN) 50 MG tablet Take 50 mg by mouth daily.    [provider]  chlorthalidone (HYGROTON) 25 MG tablet Take 25 mg by mouth daily.    [provider]  cyanocobalamin (,VITAMIN B-12,) 1000 MCG/ML injection Inject 1,000 mcg into the muscle every 30 (thirty) days.    [provider]  finasteride (PROSCAR) 5 MG tablet Take 5 mg by mouth daily.    [provider]  furosemide (LASIX) 20 MG tablet Take 20 mg by mouth 3 (three) times a week.    [provider]  hydroxychloroquine (PLAQUENIL) 200 MG tablet Take 200 mg by mouth daily.    [provider]  levothyroxine (SYNTHROID, LEVOTHROID) 25 MCG tablet Take 25 mcg by mouth daily before breakfast.    [provider]  meclizine (ANTIVERT) 25 MG tablet Take 1 tablet (25 mg total) 3 (  three) times daily as needed by mouth for dizziness. Patient not taking: Reported on 08/31/2017 04/04/17   Coral Spikes, DO  Multiple Vitamin (MULTIVITAMIN) tablet Take 1 tablet by mouth daily.    [provider]  omeprazole (PRILOSEC) 40 MG capsule Take 40 mg by mouth daily.    [provider]  potassium chloride SA (K-DUR,KLOR-CON) 20 MEQ tablet Take 20 mEq by mouth 2 (two) times daily.    [provider]  predniSONE (DELTASONE) 10 MG tablet Take 10 mg by mouth daily with breakfast.    [provider]  simvastatin  (ZOCOR) 40 MG tablet Take 40 mg by mouth daily.    [provider]  terazosin (HYTRIN) 2 MG capsule Take 2 mg by mouth at bedtime.    [provider]    Allergies Patient has no known allergies.  No family history on file.  Social History Social History   Tobacco Use  . Smoking status: Former Smoker    Last attempt to quit: 06/23/1973    Years since quitting: 44.8  . Smokeless tobacco: Never Used  Substance Use Topics  . Alcohol use: No  . Drug use: No    Review of Systems  Constitutional: No fever/chills Eyes: No visual changes. ENT: No sore throat. Respiratory: Denies cough Genitourinary: Negative for dysuria. Musculoskeletal: Negative for back pain.  Positive left hand pain Skin: Negative for rash.    ____________________________________________   PHYSICAL EXAM:  VITAL SIGNS: ED Triage Vitals  Enc Vitals Group     BP 04/25/18 1410 (!) 138/94     Pulse Rate 04/25/18 1410 80     Resp 04/25/18 1410 20     Temp 04/25/18 1410 98.5 F (36.9 C)     Temp Source 04/25/18 1410 Oral     SpO2 04/25/18 1410 98 %     Weight 04/25/18 1411 183 lb (83 kg)     Height 04/25/18 1411 6\' 2"  (1.88 m)     Head Circumference --      Peak Flow --      Pain Score 04/25/18 1411 8     Pain Loc --      Pain Edu? --      Excl. in [place]? --     Constitutional: Alert and oriented. Well appearing and in no acute distress. Eyes: Conjunctivae are normal.  Head: Atraumatic. Nose: No congestion/rhinnorhea. Mouth/Throat: Mucous membranes are moist.   Neck:  supple no lymphadenopathy noted Cardiovascular: Normal rate, regular rhythm. Heart sounds are normal Respiratory: Normal respiratory effort.  No retractions, lungs c t a  GU: deferred Musculoskeletal: FROM all extremities, warm and well perfused.  Left hand is tender across the third and fourth metacarpals.  Neurovascular is intact. Neurologic:  Normal speech and language.  Skin:  Skin is warm, dry and intact. No  rash noted. Psychiatric: Mood and affect are normal. Speech and behavior are normal.  ____________________________________________   LABS (all labs ordered are listed, but only abnormal results are displayed)  Labs Reviewed - No data to display ____________________________________________   ____________________________________________  RADIOLOGY  X-ray of the left hand shows third and fourth metacarpal  fractures.  ____________________________________________   PROCEDURES  Procedure(s) performed: Volar OCL was applied by the tech  Procedures    ____________________________________________   INITIAL IMPRESSION / ASSESSMENT AND PLAN / ED COURSE  Pertinent labs & imaging results that were available during my care of the patient were reviewed by me and considered in my medical decision  making (see chart for details).   Patient is an 83 year old male presents emergency department complaining of left hand pain after a fall.  Left hand is tender along the third and fourth metacarpals.  X-ray of the left hand shows a third and fourth metacarpal fracture.  Explained the findings to the patient.  He is placed in a volar OCL.  He is to follow-up with Dr. Rudene Christians.  Call for an appointment.  Take Tylenol for pain as needed.  He states he understands will comply.  If he develops any headache or decreased neurologic functioning to return to the emergency department immediately.  They both state they understand and will comply.  He was discharged in stable condition care of his wife.     As part of my medical decision making, I reviewed the following data within the [place] History obtained from family, Nursing notes reviewed and incorporated, Old chart reviewed, Radiograph reviewed x-ray left hand shows a third and fourth metacarpal fracture, Notes from prior ED visits and [place] Controlled Substance Database  ____________________________________________   FINAL  CLINICAL IMPRESSION(S) / ED DIAGNOSES  Final diagnoses:  Closed fracture of third metacarpal bone of right hand, unspecified fracture morphology, initial encounter  Closed fracture of fourth metacarpal bone of right hand, unspecified fracture morphology, initial encounter      NEW MEDICATIONS STARTED DURING THIS VISIT:  New Prescriptions   No medications on file     Note:  This document was prepared using Dragon voice recognition software and may include unintentional dictation errors.    Versie Starks, PA-C 04/25/18 1447    Merlyn Lot, MD 04/25/18 [phone]

## 2018-04-25 NOTE — ED Notes (Signed)
Pt tripped and fell and caught himself with his left hand at 11am - pt reports decreased strength in hand and soreness with touch

## 2020-09-05 ENCOUNTER — Encounter
Admission: EM | Disposition: A | Discharge status: Home / Self Care | Payer: Self-pay | Source: Home / Self Care | Attending: Student in an Organized Health Care Education/Training Program

## 2020-09-05 ENCOUNTER — Emergency Department: Payer: Medicare HMO | Admitting: Certified Registered"

## 2020-09-05 ENCOUNTER — Emergency Department: Payer: Medicare HMO

## 2020-09-05 ENCOUNTER — Ambulatory Visit
Admission: EM | Admit: 2020-09-05 | Discharge: 2020-09-05 | Disposition: A | Discharge status: Home / Self Care | Payer: Medicare HMO | Attending: Surgery | Admitting: Surgery

## 2020-09-05 DIAGNOSIS — M069 Rheumatoid arthritis, unspecified: Secondary | ICD-10-CM | POA: Diagnosis not present

## 2020-09-05 DIAGNOSIS — Z79899 Other long term (current) drug therapy: Secondary | ICD-10-CM | POA: Diagnosis not present

## 2020-09-05 DIAGNOSIS — E039 Hypothyroidism, unspecified: Secondary | ICD-10-CM | POA: Insufficient documentation

## 2020-09-05 DIAGNOSIS — X500XXA Overexertion from strenuous movement or load, initial encounter: Secondary | ICD-10-CM | POA: Insufficient documentation

## 2020-09-05 DIAGNOSIS — I129 Hypertensive chronic kidney disease with stage 1 through stage 4 chronic kidney disease, or unspecified chronic kidney disease: Secondary | ICD-10-CM | POA: Diagnosis not present

## 2020-09-05 DIAGNOSIS — N189 Chronic kidney disease, unspecified: Secondary | ICD-10-CM | POA: Diagnosis not present

## 2020-09-05 DIAGNOSIS — S73004A Unspecified dislocation of right hip, initial encounter: Secondary | ICD-10-CM

## 2020-09-05 DIAGNOSIS — Y9389 Activity, other specified: Secondary | ICD-10-CM | POA: Insufficient documentation

## 2020-09-05 DIAGNOSIS — Z7952 Long term (current) use of systemic steroids: Secondary | ICD-10-CM | POA: Diagnosis not present

## 2020-09-05 DIAGNOSIS — T84020A Dislocation of internal right hip prosthesis, initial encounter: Secondary | ICD-10-CM | POA: Insufficient documentation

## 2020-09-05 DIAGNOSIS — J849 Interstitial pulmonary disease, unspecified: Secondary | ICD-10-CM | POA: Diagnosis not present

## 2020-09-05 DIAGNOSIS — Z20822 Contact with and (suspected) exposure to covid-19: Secondary | ICD-10-CM | POA: Diagnosis not present

## 2020-09-05 DIAGNOSIS — Z87891 Personal history of nicotine dependence: Secondary | ICD-10-CM | POA: Diagnosis not present

## 2020-09-05 DIAGNOSIS — Z419 Encounter for procedure for purposes other than remedying health state, unspecified: Secondary | ICD-10-CM

## 2020-09-05 DIAGNOSIS — Z7982 Long term (current) use of aspirin: Secondary | ICD-10-CM | POA: Diagnosis not present

## 2020-09-05 HISTORY — PX: HIP CLOSED REDUCTION: SHX983

## 2020-09-05 LAB — CBC
HCT: 30.8 % — ABNORMAL LOW (ref 39.0–52.0)
Hemoglobin: 10.7 g/dL — ABNORMAL LOW (ref 13.0–17.0)
MCH: 34.9 pg — ABNORMAL HIGH (ref 26.0–34.0)
MCHC: 34.7 g/dL (ref 30.0–36.0)
MCV: 100.3 fL — ABNORMAL HIGH (ref 80.0–100.0)
Platelets: 135 10*3/uL — ABNORMAL LOW (ref 150–400)
RBC: 3.07 MIL/uL — ABNORMAL LOW (ref 4.22–5.81)
RDW: 12.3 % (ref 11.5–15.5)
WBC: 3.9 10*3/uL — ABNORMAL LOW (ref 4.0–10.5)
nRBC: 0 % (ref 0.0–0.2)

## 2020-09-05 LAB — BASIC METABOLIC PANEL
Anion gap: 7 (ref 5–15)
BUN: 19 mg/dL (ref 8–23)
CO2: 27 mmol/L (ref 22–32)
Calcium: 8.9 mg/dL (ref 8.9–10.3)
Chloride: 96 mmol/L — ABNORMAL LOW (ref 98–111)
Creatinine, Ser: 1.67 mg/dL — ABNORMAL HIGH (ref 0.61–1.24)
GFR, Estimated: 40 mL/min — ABNORMAL LOW (ref >60)
Glucose, Bld: 94 mg/dL (ref 70–99)
Potassium: 3.6 mmol/L (ref 3.5–5.1)
Sodium: 130 mmol/L — ABNORMAL LOW (ref 135–145)

## 2020-09-05 LAB — RESP PANEL BY RT-PCR (FLU A&B, COVID) ARPGX2
Influenza A by PCR: NEGATIVE
Influenza B by PCR: NEGATIVE
SARS Coronavirus 2 by RT PCR: NEGATIVE

## 2020-09-05 SURGERY — CLOSED REDUCTION, HIP
Anesthesia: General | Site: Hip | Laterality: Right

## 2020-09-05 MED ORDER — LABETALOL HCL 5 MG/ML IV SOLN: 10.0000 mg | Freq: Once | INTRAVENOUS | Status: AC

## 2020-09-05 MED ORDER — PROPOFOL 10 MG/ML IV BOLUS
INTRAVENOUS | Status: DC | PRN
  Administered 2020-09-05: 110 mg via INTRAVENOUS

## 2020-09-05 MED ORDER — ACETAMINOPHEN 325 MG PO TABS
650.0000 mg | ORAL_TABLET | Freq: Once | ORAL | Status: AC
  Administered 2020-09-05: 650 mg via ORAL

## 2020-09-05 MED ORDER — LABETALOL HCL 5 MG/ML IV SOLN
INTRAVENOUS | Status: AC
  Administered 2020-09-05: 10 mg via INTRAVENOUS
  Filled 2020-09-05: qty 4

## 2020-09-05 MED ORDER — PROPOFOL 10 MG/ML IV BOLUS
INTRAVENOUS | Status: AC
  Filled 2020-09-05: qty 20

## 2020-09-05 MED ORDER — IPRATROPIUM-ALBUTEROL 0.5-2.5 (3) MG/3ML IN SOLN: 3.0000 mL | Freq: Once | RESPIRATORY_TRACT | Status: AC

## 2020-09-05 MED ORDER — SUGAMMADEX SODIUM 200 MG/2ML IV SOLN
INTRAVENOUS | Status: DC | PRN
  Administered 2020-09-05: 200 mg via INTRAVENOUS

## 2020-09-05 MED ORDER — IPRATROPIUM-ALBUTEROL 0.5-2.5 (3) MG/3ML IN SOLN
RESPIRATORY_TRACT | Status: AC
  Administered 2020-09-05: 3 mL via RESPIRATORY_TRACT
  Filled 2020-09-05: qty 3

## 2020-09-05 MED ORDER — LIDOCAINE HCL (PF) 2 % IJ SOLN
INTRAMUSCULAR | Status: AC
  Filled 2020-09-05: qty 5

## 2020-09-05 MED ORDER — ACETAMINOPHEN 325 MG PO TABS
ORAL_TABLET | ORAL | Status: AC
  Filled 2020-09-05: qty 2

## 2020-09-05 MED ORDER — PROPOFOL 10 MG/ML IV BOLUS
INTRAVENOUS | Status: AC | PRN
  Administered 2020-09-05: 20 mg via INTRAVENOUS
  Administered 2020-09-05: 60 mg via INTRAVENOUS
  Administered 2020-09-05 (×2): 20 mg via INTRAVENOUS

## 2020-09-05 MED ORDER — PROPOFOL 10 MG/ML IV BOLUS
0.5000 mg/kg | Freq: Once | INTRAVENOUS | Status: DC
  Filled 2020-09-05: qty 20

## 2020-09-05 MED ORDER — MEPERIDINE HCL 50 MG/ML IJ SOLN
INTRAMUSCULAR | Status: AC
  Administered 2020-09-05: 25 mg
  Filled 2020-09-05: qty 1

## 2020-09-05 MED ORDER — MORPHINE SULFATE (PF) 4 MG/ML IV SOLN
4.0000 mg | INTRAVENOUS | Status: DC | PRN
  Administered 2020-09-05: 4 mg via INTRAVENOUS
  Filled 2020-09-05: qty 1

## 2020-09-05 MED ORDER — LACTATED RINGERS IV SOLN: INTRAVENOUS | Status: DC | PRN

## 2020-09-05 MED ORDER — TRAMADOL HCL 50 MG PO TABS: 50.0000 mg | ORAL_TABLET | Freq: Four times a day (QID) | ORAL | 0 refills | Status: AC | PRN

## 2020-09-05 MED ORDER — EPHEDRINE SULFATE 50 MG/ML IJ SOLN
INTRAMUSCULAR | Status: DC | PRN
  Administered 2020-09-05: 10 mg via INTRAVENOUS

## 2020-09-05 MED ORDER — FENTANYL CITRATE (PF) 100 MCG/2ML IJ SOLN
INTRAMUSCULAR | Status: AC
  Filled 2020-09-05: qty 2

## 2020-09-05 MED ORDER — ONDANSETRON HCL 4 MG/2ML IJ SOLN: 4.0000 mg | Freq: Once | INTRAMUSCULAR | Status: DC | PRN

## 2020-09-05 MED ORDER — FENTANYL CITRATE (PF) 100 MCG/2ML IJ SOLN: 25.0000 ug | INTRAMUSCULAR | Status: DC | PRN

## 2020-09-05 MED ORDER — SUCCINYLCHOLINE CHLORIDE 20 MG/ML IJ SOLN
INTRAMUSCULAR | Status: DC | PRN
  Administered 2020-09-05: 120 mg via INTRAVENOUS

## 2020-09-05 MED ORDER — ONDANSETRON HCL 4 MG/2ML IJ SOLN
4.0000 mg | Freq: Once | INTRAMUSCULAR | Status: AC
  Administered 2020-09-05: 4 mg via INTRAVENOUS
  Filled 2020-09-05: qty 2

## 2020-09-05 MED ORDER — LIDOCAINE HCL (CARDIAC) PF 100 MG/5ML IV SOSY
PREFILLED_SYRINGE | INTRAVENOUS | Status: DC | PRN
  Administered 2020-09-05: 80 mg via INTRAVENOUS

## 2020-09-05 MED ORDER — MEPERIDINE HCL 25 MG/ML IJ SOLN: 25.0000 mg | Freq: Once | INTRAMUSCULAR | Status: DC

## 2020-09-05 MED ORDER — KETAMINE HCL 10 MG/ML IJ SOLN
1.0000 mg/kg | Freq: Once | INTRAMUSCULAR | Status: DC
  Filled 2020-09-05: qty 1

## 2020-09-05 MED ORDER — KETAMINE HCL 10 MG/ML IJ SOLN
INTRAMUSCULAR | Status: AC | PRN
  Administered 2020-09-05: 40 mg via INTRAVENOUS

## 2020-09-05 MED ORDER — NALOXONE HCL 0.4 MG/ML IJ SOLN
INTRAMUSCULAR | Status: DC | PRN
  Administered 2020-09-05: .2 mg via INTRAVENOUS

## 2020-09-05 MED ORDER — FENTANYL CITRATE (PF) 100 MCG/2ML IJ SOLN
INTRAMUSCULAR | Status: DC | PRN
  Administered 2020-09-05: 50 ug via INTRAVENOUS

## 2020-09-05 SURGICAL SUPPLY — 10 items
COVER WAND RF STERILE (DRAPES) IMPLANT
GLOVE SURG ENC MOIS LTX SZ8 (GLOVE) IMPLANT
GOWN STRL REUS W/ TWL LRG LVL3 (GOWN DISPOSABLE) IMPLANT
GOWN STRL REUS W/ TWL XL LVL3 (GOWN DISPOSABLE) IMPLANT
GOWN STRL REUS W/TWL LRG LVL3 (GOWN DISPOSABLE)
GOWN STRL REUS W/TWL XL LVL3 (GOWN DISPOSABLE)
IMMOB KNEE 24 THIGH 24 443303 (SOFTGOODS) ×2 IMPLANT
KIT TURNOVER KIT A (KITS) ×2 IMPLANT
MANIFOLD NEPTUNE II (INSTRUMENTS) IMPLANT
PACK HIP PROSTHESIS (MISCELLANEOUS) IMPLANT

## 2020-09-05 NOTE — Transfer of Care (Signed)
Immediate Anesthesia Transfer of Care Note  Patient: Edwin Christensen  Procedure(s) Performed: CLOSED REDUCTION HIP (Right Hip)  Patient Location: PACU  Anesthesia Type:General  Level of Consciousness: awake, alert  and drowsy  Airway & Oxygen Therapy: Patient Spontanous Breathing and Patient connected to face mask oxygen  Post-op Assessment: Report given to RN and Post -op Vital signs reviewed and stable  Post vital signs: stable  Last Vitals:  Vitals Value Taken Time  BP 156/89 09/05/20 1331  Temp    Pulse 72 09/05/20 1334  Resp 23 09/05/20 1334  SpO2 100 % 09/05/20 1334  Vitals shown include unvalidated device data.  Last Pain:  Vitals:   09/05/20 1137  TempSrc: Temporal  PainSc: 3          Complications: No complications documented.

## 2020-09-05 NOTE — ED Notes (Signed)
Patient undressed and placed in gown. Continuous cardiac and pulse ox monitoring. High fall precautions in place. Stretcher in low position with wheels locked, side rails up x2. Call light in reach.

## 2020-09-05 NOTE — Discharge Instructions (Addendum)
Orthopedic discharge instructions: May shower as of tomorrow.  Apply ice frequently to hip. Take ibuprofen 600 mg TID with meals for 5-7 days, then as necessary. Take pain medication as prescribed or ES Tylenol when needed. Keep knee immobilizer on at all times except may remove for bathing purposes. May weight-bear as tolerated on right leg - use crutches or walker as needed. Follow-up in 1 month.   Dr Jenny Reichmann Poggi's office # [phone] Follow-up appointment on May 18th at 9:15 with Rema Fendt PA.   AMBULATORY SURGERY  DISCHARGE INSTRUCTIONS   1) The drugs that you were given will stay in your system until tomorrow so for the next 24 hours you should not:  A) Drive an automobile B) Make any legal decisions C) Drink any alcoholic beverage   2) You may resume regular meals tomorrow.  Today it is better to start with liquids and gradually work up to solid foods.  You may eat anything you prefer, but it is better to start with liquids, then soup and crackers, and gradually work up to solid foods.   3) Please notify your doctor immediately if you have any unusual bleeding, trouble breathing, redness and pain at the surgery site, drainage, fever, or pain not relieved by medication.    4) Additional Instructions:        Please contact your physician with any problems or Same Day Surgery at [phone], Monday through Friday 6 am to 4 pm, or [place] at [facility] number at [phone].

## 2020-09-05 NOTE — ED Provider Notes (Signed)
.  Sedation  Date/Time: 09/05/2020 3:35 PM Performed by: Vanessa Wilton, MD Authorized by: Vanessa Edmunds, MD   Consent:    Consent obtained:  Verbal   Consent given by:  Spouse   Risks discussed:  Allergic reaction, dysrhythmia, inadequate sedation, nausea, vomiting, respiratory compromise necessitating ventilatory assistance and intubation, prolonged sedation necessitating reversal and prolonged hypoxia resulting in organ damage   Alternatives discussed:  Analgesia without sedation and anxiolysis Universal protocol:    Immediately prior to procedure, a time out was called: yes   Indications:    Procedure performed:  Fracture reduction Pre-sedation assessment:    Time since last food or drink:  8 hours    ASA classification: class 2 - patient with mild systemic disease     Mouth opening:  3 or more finger widths   Thyromental distance:  4 finger widths   Mallampati score:  III - soft palate, base of uvula visible   Neck mobility: normal     Pre-sedation assessments completed and reviewed: airway patency, cardiovascular function, hydration status, mental status, nausea/vomiting, pain level, respiratory function and temperature     Pre-sedation assessment completed:  09/05/2020 10:45 AM Immediate pre-procedure details:    Reviewed: vital signs, relevant labs/tests and NPO status     Verified: bag valve mask available   Procedure details (see MAR for exact dosages):    Preoxygenation:  Room air   Sedation:  Propofol and ketamine   Intended level of sedation: deep   Intra-procedure monitoring:  Blood pressure monitoring, cardiac monitor, continuous pulse oximetry, continuous capnometry, frequent LOC assessments and frequent vital sign checks   Intra-procedure events: respiratory depression     Intra-procedure management:  Airway repositioning and BVM ventilation   Total Provider sedation time (minutes):  20 Post-procedure details:    Post-sedation assessment completed:  09/05/2020 12:05  PM   Recovery: Patient returned to pre-procedure baseline     Procedure completion:  Tolerated well, no immediate complications Comments:     Patient initially got 40 of propofol and 40 of ketamine but was still having a lot of tension on his muscles therefore additional propofol was given.  Patient had brief apnea with the propofol required bag-valve-mask.  No vomiting or other issues.       Vanessa [place], MD 09/05/20 1537

## 2020-09-05 NOTE — Op Note (Signed)
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  Post-Op Diagnosis:   Same.  Procedure:   Closed reduction of recurrent right prosthetic hip dislocation.  Surgeon:   Pascal Lux, MD  Assistant:   Marijean Bravo, PA-S  Anesthesia:   GET  Findings:   As above.  Complications:   None  EBL:   None  Fluids:   400 cc crystalloid  TT:   None  Drains:   None  Closure:   None  Implants:   None  Brief Clinical Note:   The patient is a 86 year old male who is now nearly 20 years status post a revision right total hip arthroplasty which has dislocated on several prior occasions since the revision hip replacement was performed. The patient apparently was reaching down to get his remote from the floor when the hip dislocated on him. He was brought to the emergency room where x-rays demonstrated the above-noted injury. An attempted closed reduction by the ER providers under IV sedation failed. Therefore, the patient is being brought to the operating room at this time for closed reduction under general anesthesia of this dislocation.  Procedure:   The patient was brought into the operating room and lain in the supine position. After adequate general endotracheal intubation and anesthesia were obtained, a timeout was performed to verify the appropriate surgical site. The hip was then reduced using longitudinal traction with internal rotation and flexion while countertraction was applied to the pelvis. The hip was felt to clunk back into place. The leg lengths were now equal and leg rotation was symmetric to the contralateral side. The hip was stable to flexion to 90 with internal rotation to 10. The adequacy of reduction was confirmed by fluoroscopic imaging before the patient's right lower extremity was placed into a knee immobilizer. The patient was then awakened, extubated, and returned to the recovery room in  satisfactory condition after tolerating the procedure well.

## 2020-09-05 NOTE — H&P (Signed)
Subjective:  Chief complaint: Right hip pain.  The patient is a 85 y.o. male who sustained an injury to the right hip early this morning when he apparently was reaching down to pick up a remote.  He was brought to the emergency room where x-rays demonstrated a posterior superior dislocation of the right total hip arthroplasty.  The patient is status post a revision right total hip arthroplasty approximately 5 years ago and has had several prior posterior hip dislocations.  The patient denies any associated injury. The patient did not strike his head or lose consciousness. The patient also denies any light-headedness, dizziness, chest pain, or shortness of breath which might have contributed to the injury.  There are no problems to display for this patient.  Past Medical History:  Diagnosis Date  . Arthritis    rheumatoid, osteoarthritis  . Chronic kidney disease    renal insufficiency  . Coronary artery disease   . GERD (gastroesophageal reflux disease)   . Hypertension   . Hyperuricemia   . Hypothyroidism   . Interstitial lung disease ([place])   . Sleep apnea    mld - CPAP  . Wears dentures    full upper, partial lower    Past Surgical History:  Procedure Laterality Date  . CATARACT EXTRACTION W/PHACO Right 09/08/2017   Procedure: CATARACT EXTRACTION PHACO AND INTRAOCULAR LENS PLACEMENT ([place]) RIGHT;  Surgeon: Leandrew Koyanagi, MD;  Location: [place];  Service: Ophthalmology;  Laterality: Right;  sleep apnea  . CATARACT EXTRACTION W/PHACO Left 10/06/2017   Procedure: CATARACT EXTRACTION PHACO AND INTRAOCULAR LENS PLACEMENT ([place]) LEFT;  Surgeon: Leandrew Koyanagi, MD;  Location: [place];  Service: Ophthalmology;  Laterality: Left;  . CERVICAL FUSION    . COLONOSCOPY    . CORONARY ARTERY BYPASS GRAFT  1992  . ESOPHAGOGASTRODUODENOSCOPY    . GASTROC RECESSION EXTREMITY Left 07/05/2015   Procedure: GASTROC RECESSION EXTREMITY;  Surgeon: Albertine Patricia, DPM;   Location: [facility];  Service: Podiatry;  Laterality: Left;  LMA w/ local  . JOINT REPLACEMENT Right    hip  . OSTECTOMY Left 07/05/2015   Procedure: OSTECTOMY 4th & 5th metatarsals left foot;  Surgeon: Albertine Patricia, DPM;  Location: [facility];  Service: Podiatry;  Laterality: Left;  LMA w/ local    Medications Prior to Admission  Medication Sig Dispense Refill Last Dose  . acetaminophen (TYLENOL) 325 MG tablet Take 650 mg by mouth as needed.   09/04/2020 at Unknown time  . aspirin 325 MG tablet Take 325 mg by mouth daily.   09/04/2020 at Unknown time  . azaTHIOprine (IMURAN) 50 MG tablet Take 50 mg by mouth daily.   09/04/2020 at Unknown time  . chlorthalidone (HYGROTON) 25 MG tablet Take 25 mg by mouth daily.   09/04/2020 at Unknown time  . cyanocobalamin (,VITAMIN B-12,) 1000 MCG/ML injection Inject 1,000 mcg into the muscle every 30 (thirty) days.   09/04/2020 at Unknown time  . finasteride (PROSCAR) 5 MG tablet Take 5 mg by mouth daily.   09/04/2020 at Unknown time  . furosemide (LASIX) 20 MG tablet Take 20 mg by mouth 3 (three) times a week.   09/04/2020 at Unknown time  . hydroxychloroquine (PLAQUENIL) 200 MG tablet Take 200 mg by mouth daily.   09/04/2020 at Unknown time  . levothyroxine (SYNTHROID, LEVOTHROID) 25 MCG tablet Take 25 mcg by mouth daily before breakfast.   09/04/2020 at Unknown time  . meclizine (ANTIVERT) 25 MG tablet Take 1 tablet (25 mg total)  3 (three) times daily as needed by mouth for dizziness. 30 tablet 0 09/04/2020 at Unknown time  . Multiple Vitamin (MULTIVITAMIN) tablet Take 1 tablet by mouth daily.   09/04/2020 at Unknown time  . omeprazole (PRILOSEC) 40 MG capsule Take 40 mg by mouth daily.   09/04/2020 at Unknown time  . potassium chloride SA (K-DUR,KLOR-CON) 20 MEQ tablet Take 20 mEq by mouth 2 (two) times daily.   09/04/2020 at Unknown time  . predniSONE (DELTASONE) 10 MG tablet Take 10 mg by mouth daily with breakfast.   09/04/2020 at Unknown time  . simvastatin (ZOCOR)  40 MG tablet Take 40 mg by mouth daily.   09/04/2020 at Unknown time  . terazosin (HYTRIN) 2 MG capsule Take 2 mg by mouth at bedtime.   09/04/2020 at Unknown time   No Known Allergies  Social History   Tobacco Use  . Smoking status: Former Smoker    Quit date: 06/23/1973    Years since quitting: 47.2  . Smokeless tobacco: Never Used  Substance Use Topics  . Alcohol use: No    History reviewed. No pertinent family history.   Review of Systems: As noted above. The patient denies any chest pain, shortness of breath, nausea, vomiting, diarrhea, constipation, belly pain, blood in his/her stool, or burning with urination.  Objective: Temp:  [97.8 F (36.6 C)-98 F (36.7 C)] 98 F (36.7 C) (04/21 1137) Pulse Rate:  [72-82] 72 (04/21 1137) Resp:  [12-18] 14 (04/21 1137) BP: (137-185)/(84-102) 161/84 (04/21 1137) SpO2:  [99 %-100 %] 100 % (04/21 1137) Weight:  [77.1 kg] 77.1 kg (04/21 0904)  Physical Exam: General:  Alert, no acute distress Psychiatric:  Patient is competent for consent with normal mood and affect Cardiovascular:  RRR  Respiratory:  Clear to auscultation. No wheezing. Non-labored breathing GI:  Abdomen is soft and non-tender Skin:  No lesions in the area of chief complaint Neurologic:  Sensation intact distally Lymphatic:  No axillary or cervical lymphadenopathy  Orthopedic Exam:  Orthopedic examination is limited to the right hip and lower extremity.  The right lower extremity is shortened and internally rotated as compared to the left.  There is a well-healed surgical incision over the posterior lateral aspect of the right hip.  Skin inspection is notable for some deformity of the right buttock region due to the prosthetic hip dislocation, but otherwise is unremarkable.  No swelling, erythema, ecchymosis, abrasions, or other skin abnormalities are identified.  He has mild to moderate tenderness to palpation over the lateral aspect of the hip.  He has more severe pain  with any attempted active motion motion of the hip.  He is neurovascularly intact to the right lower extremity and foot.  Imaging Review: Recent x-rays of the pelvis and right hip are available for review.  These films demonstrate a posterior superior dislocation of the prosthetic right total hip arthroplasty.  The acetabulum and femoral components both appear to be well fixed and without evidence of loosening.  There does appear to be an old nonunited fracture of the greater trochanteric region with minimal displacement of the nonunited fracture fragment, but no acute fractures or other bony abnormalities are identified.  Assessment: Closed posterior dislocation of right total hip prosthesis.  Plan: The treatment options, including both surgical and nonsurgical choices, have been discussed in detail with the patient and his family. The patient and his wife would like to proceed with surgical intervention to include a closed, possible open reduction of the dislocated right  total hip arthroplasty. The risks (including bleeding, infection, nerve and/or blood vessel injury, persistent or recurrent pain, loosening or failure of the components, leg length inequality, dislocation, need for further surgery, blood clots, strokes, heart attacks or arrhythmias, pneumonia, etc.) and benefits of the surgical procedure were discussed. The patient states his understanding and agrees to proceed. He agrees to a blood transfusion if necessary. A formal written consent has been obtained by the nursing staff.

## 2020-09-05 NOTE — Anesthesia Preprocedure Evaluation (Signed)
Anesthesia Evaluation  Patient identified by MRN, date of birth, ID band Patient awake    Reviewed: Allergy & Precautions, H&P , NPO status , Patient's Chart, lab work & pertinent test results  Airway Mallampati: III  TM Distance: >3 FB Neck ROM: full    Dental  (+) Upper Dentures, Partial Lower   Pulmonary sleep apnea and Continuous Positive Airway Pressure Ventilation , COPD, former smoker,    Pulmonary exam normal breath sounds clear to auscultation       Cardiovascular Exercise Tolerance: Good hypertension, + CAD  Normal cardiovascular exam Rhythm:Regular Rate:Normal     Neuro/Psych negative neurological ROS  negative psych ROS   GI/Hepatic GERD  ,  Endo/Other  Hypothyroidism   Renal/GU Renal disease  negative genitourinary   Musculoskeletal   Abdominal   Peds negative pediatric ROS (+)  Hematology negative hematology ROS (+)   Anesthesia Other Findings   Reproductive/Obstetrics                             Anesthesia Physical  Anesthesia Plan  ASA: III  Anesthesia Plan: General   Post-op Pain Management:    Induction: Intravenous  PONV Risk Score and Plan: 1  Airway Management Planned: Oral ETT  Additional Equipment:   Intra-op Plan:   Post-operative Plan: Extubation in OR  Informed Consent: I have reviewed the patients History and Physical, chart, labs and discussed the procedure including the risks, benefits and alternatives for the proposed anesthesia with the patient or authorized representative who has indicated his/her understanding and acceptance.       Plan Discussed with: CRNA and Surgeon  Anesthesia Plan Comments:         Anesthesia Quick Evaluation

## 2020-09-05 NOTE — Sedation Documentation (Addendum)
Edwin Pigg, MD stopped bagging patient.

## 2020-09-05 NOTE — Sedation Documentation (Signed)
Jari Pigg, MD began bagging patient.

## 2020-09-05 NOTE — Anesthesia Procedure Notes (Signed)
Procedure Name: Intubation Date/Time: 09/05/2020 12:38 PM Performed by: Natasha Mead, CRNA Pre-anesthesia Checklist: Patient identified, Emergency Drugs available, Suction available and Patient being monitored Patient Re-evaluated:Patient Re-evaluated prior to induction Oxygen Delivery Method: Circle system utilized Preoxygenation: Pre-oxygenation with 100% oxygen Induction Type: IV induction Ventilation: Mask ventilation without difficulty Laryngoscope Size: Miller and 2 Grade View: Grade I Tube type: Oral Number of attempts: 1 Airway Equipment and Method: Stylet and Oral airway Placement Confirmation: ETT inserted through vocal cords under direct vision,  positive ETCO2 and breath sounds checked- equal and bilateral Secured at: 22 cm Tube secured with: Tape Dental Injury: Teeth and Oropharynx as per pre-operative assessment

## 2020-09-05 NOTE — Sedation Documentation (Signed)
Edwin Cornwall, Md finished reduction.

## 2020-09-05 NOTE — ED Triage Notes (Signed)
Right hip dislocation. Pt from home, states he bent over to pick up a remote and felt his hip come out of place. He states this has occurred several times since his last hip replacement surgery which was a couple of years ago.

## 2020-09-05 NOTE — Sedation Documentation (Signed)
Patient taken to OR for procedure.

## 2020-09-05 NOTE — Sedation Documentation (Signed)
Quentin Cornwall, MD began reduction.

## 2020-09-05 NOTE — ED Provider Notes (Signed)
[facility] Emergency Department Provider Note    Event Date/Time   First MD Initiated Contact with Patient 09/05/20 [phone]     (approximate)  I have reviewed the triage vital signs and the nursing notes.   HISTORY  Chief Complaint Hip Injury    HPI Edwin Christensen is a 86 y.o. male below listed past medical history presents to the ER for evaluation of right hip pain that occurred at home after he was reaching down to pick up a remote.  Did not hit his head.  No neck pain.  States his pain currently is moderate to severe.  No numbness or tingling.  Is status post right hip replacement several years ago has had several dislocations in the past.   Past Medical History:  Diagnosis Date  . Arthritis    rheumatoid, osteoarthritis  . Chronic kidney disease    renal insufficiency  . Coronary artery disease   . GERD (gastroesophageal reflux disease)   . Hypertension   . Hyperuricemia   . Hypothyroidism   . Interstitial lung disease ([place])   . Sleep apnea    mld - CPAP  . Wears dentures    full upper, partial lower   History reviewed. No pertinent family history. Past Surgical History:  Procedure Laterality Date  . CATARACT EXTRACTION W/PHACO Right 09/08/2017   Procedure: CATARACT EXTRACTION PHACO AND INTRAOCULAR LENS PLACEMENT ([place]) RIGHT;  Surgeon: Leandrew Koyanagi, MD;  Location: [place];  Service: Ophthalmology;  Laterality: Right;  sleep apnea  . CATARACT EXTRACTION W/PHACO Left 10/06/2017   Procedure: CATARACT EXTRACTION PHACO AND INTRAOCULAR LENS PLACEMENT ([place]) LEFT;  Surgeon: Leandrew Koyanagi, MD;  Location: [place];  Service: Ophthalmology;  Laterality: Left;  . CERVICAL FUSION    . COLONOSCOPY    . CORONARY ARTERY BYPASS GRAFT  1992  . ESOPHAGOGASTRODUODENOSCOPY    . GASTROC RECESSION EXTREMITY Left 07/05/2015   Procedure: GASTROC RECESSION EXTREMITY;  Surgeon: Albertine Patricia, DPM;  Location: [facility];  Service:  Podiatry;  Laterality: Left;  LMA w/ local  . JOINT REPLACEMENT Right    hip  . OSTECTOMY Left 07/05/2015   Procedure: OSTECTOMY 4th & 5th metatarsals left foot;  Surgeon: Albertine Patricia, DPM;  Location: [facility];  Service: Podiatry;  Laterality: Left;  LMA w/ local   There are no problems to display for this patient.     Prior to Admission medications   Medication Sig Start Date End Date Taking? Authorizing Provider  acetaminophen (TYLENOL) 325 MG tablet Take 650 mg by mouth as needed.    [provider]  aspirin 325 MG tablet Take 325 mg by mouth daily.    [provider]  azaTHIOprine (IMURAN) 50 MG tablet Take 50 mg by mouth daily.    [provider]  chlorthalidone (HYGROTON) 25 MG tablet Take 25 mg by mouth daily.    [provider]  cyanocobalamin (,VITAMIN B-12,) 1000 MCG/ML injection Inject 1,000 mcg into the muscle every 30 (thirty) days.    [provider]  finasteride (PROSCAR) 5 MG tablet Take 5 mg by mouth daily.    [provider]  furosemide (LASIX) 20 MG tablet Take 20 mg by mouth 3 (three) times a week.    [provider]  hydroxychloroquine (PLAQUENIL) 200 MG tablet Take 200 mg by mouth daily.    [provider]  levothyroxine (SYNTHROID, LEVOTHROID) 25 MCG tablet Take 25 mcg by mouth daily before breakfast.    [provider]  meclizine (ANTIVERT) 25 MG tablet Take 1 tablet (25 mg total) 3 (three) times daily as needed by mouth for dizziness. Patient not taking: Reported on 08/31/2017 04/04/17   Coral Spikes, DO  Multiple Vitamin (MULTIVITAMIN) tablet Take 1 tablet by mouth daily.    [provider]  omeprazole (PRILOSEC) 40 MG capsule Take 40 mg by mouth daily.    [provider]  potassium chloride SA (K-DUR,KLOR-CON) 20 MEQ tablet Take 20 mEq by mouth 2 (two) times daily.    [provider]  predniSONE (DELTASONE) 10 MG tablet Take 10 mg by mouth daily with  breakfast.    [provider]  simvastatin (ZOCOR) 40 MG tablet Take 40 mg by mouth daily.    [provider]  terazosin (HYTRIN) 2 MG capsule Take 2 mg by mouth at bedtime.    [provider]    Allergies Patient has no known allergies.    Social History Social History   Tobacco Use  . Smoking status: Former Smoker    Quit date: 06/23/1973    Years since quitting: 47.2  . Smokeless tobacco: Never Used  Substance Use Topics  . Alcohol use: No  . Drug use: No    Review of Systems Patient denies headaches, rhinorrhea, blurry vision, numbness, shortness of breath, chest pain, edema, cough, abdominal pain, nausea, vomiting, diarrhea, dysuria, fevers, rashes or hallucinations unless otherwise stated above in HPI. ____________________________________________   PHYSICAL EXAM:  VITAL SIGNS: Vitals:   09/05/20 1101 09/05/20 1106  BP: (!) 161/102 (!) 159/90  Pulse: 80 75  Resp: 13 15  Temp:    SpO2: 100% 100%    Constitutional: Alert and oriented.  Eyes: Conjunctivae are normal.  Head: Atraumatic. Nose: No congestion/rhinnorhea. Mouth/Throat: Mucous membranes are moist.   Neck: No stridor. Painless ROM.  Cardiovascular: Normal rate, regular rhythm. Grossly normal heart sounds.  Good peripheral circulation. Respiratory: Normal respiratory effort.  No retractions. Lungs CTAB. Gastrointestinal: Soft and nontender. No distention. No abdominal bruits. No CVA tenderness. Genitourinary:  Musculoskeletal: rle shortened and internally rotated No joint effusions. Neurologic:  Normal speech and language. No gross focal neurologic deficits are appreciated. No facial droop Skin:  Skin is warm, dry and intact. No rash noted. Psychiatric: Mood and affect are normal. Speech and behavior are normal.  ____________________________________________   LABS (all labs ordered are listed, but only abnormal results are displayed)  Results for orders placed or  performed during the hospital encounter of 09/05/20 (from the past 24 hour(s))  CBC     Status: Abnormal   Collection Time: 09/05/20  9:12 AM  Result Value Ref Range   WBC 3.9 (L) 4.0 - 10.5 K/uL   RBC 3.07 (L) 4.22 - 5.81 MIL/uL   Hemoglobin 10.7 (L) 13.0 - 17.0 g/dL   HCT 30.8 (L) 39.0 - 52.0 %   MCV 100.3 (H) 80.0 - 100.0 fL   MCH 34.9 (H) 26.0 - 34.0 pg   MCHC 34.7 30.0 - 36.0 g/dL   RDW 12.3 11.5 - 15.5 %   Platelets 135 (L) 150 - 400 K/uL   nRBC 0.0 0.0 - 0.2 %  Basic metabolic panel     Status: Abnormal   Collection Time: 09/05/20  9:12 AM  Result Value Ref Range   Sodium 130 (L) 135 - 145 mmol/L   Potassium 3.6 3.5 - 5.1 mmol/L   Chloride 96 (L) 98 - 111 mmol/L   CO2 27 22 - 32 mmol/L  Glucose, Bld 94 70 - 99 mg/dL   BUN 19 8 - 23 mg/dL   Creatinine, Ser 1.67 (H) 0.61 - 1.24 mg/dL   Calcium 8.9 8.9 - 10.3 mg/dL   GFR, Estimated 40 (L) >60 mL/min   Anion gap 7 5 - 15  Resp Panel by RT-PCR (Flu A&B, Covid) Nasopharyngeal Swab     Status: None   Collection Time: 09/05/20  9:28 AM   Specimen: Nasopharyngeal Swab; Nasopharyngeal(NP) swabs in vial transport medium  Result Value Ref Range   SARS Coronavirus 2 by RT PCR NEGATIVE NEGATIVE   Influenza A by PCR NEGATIVE NEGATIVE   Influenza B by PCR NEGATIVE NEGATIVE   ____________________________________________  EKG My review and personal interpretation at Time: 9:08   Indication: pre-op  Rate: 80  Rhythm: sinus Axis: right Other: normal intervals, no stemi, nonspecific st abn ____________________________________________  RADIOLOGY  I personally reviewed all radiographic images ordered to evaluate for the above acute complaints and reviewed radiology reports and findings.  These findings were personally discussed with the patient.  Please see medical record for radiology report.  ____________________________________________   PROCEDURES  Procedure(s) performed:  .Ortho Injury Treatment  Date/Time: 09/05/2020  11:16 AM Performed by: Merlyn Lot, MD Authorized by: Merlyn Lot, MD   Consent:    Consent obtained:  Written   Consent given by:  Patient   Risks discussed:  Irreducible dislocation, fracture, nerve damage, restricted joint movement, stiffness, vascular damage and recurrent dislocationInjury location: hip Location details: right hip Injury type: dislocation Dislocation type: posterior Spontaneous dislocation: yes Prosthesis: yes Pre-procedure neurovascular assessment: neurovascularly intact  Patient sedated: Yes. Refer to sedation procedure documentation for details of sedation. Manipulation performed: yes Reduction method: traction and counter traction, external rotation and abduction Reduction successful: no Post-procedure neurovascular assessment: post-procedure neurovascularly intact       Critical Care performed: no ____________________________________________   INITIAL IMPRESSION / ASSESSMENT AND PLAN / ED COURSE  Pertinent labs & imaging results that were available during my care of the patient were reviewed by me and considered in my medical decision making (see chart for details).   DDX: dislocation, fracture, contusion, bursitis  Edwin Christensen is a 86 y.o. who presents to the ED with presentation as described above.  Patient with history of recurrent right hip dislocation presenting with similar presentation.  No other associated injury.  Is neurovascular intact.  Clinical Course as of 09/05/20 1119  Thu Sep 05, 2020  0932 Patient with evidence of acute right hip dislocation.  On review of care everywhere patient had ED reduction under propofol sedation last year.  Will discuss with orthopedics but anticipate plan for reduction here in the ER.  He remains neurovascular intact.  Pain improved after IV morphine.  NPO since last night. [PR]  1107 After discussing case with Dr. Roland Rack orthopedic plan was to attempt reduction here in the ER.  Attempt was  made with Dr. Jari Pigg performed procedural sedation while I was attempting reduction.  After multiple attempts we were unable to maintain hip in reduced position.  Will discussed with orthopedics. [PR]  1118 Dr. Roland Rack to take patient to the OR for reduction under anesthesia.  Have discussed with the patient and available family all diagnostics and treatments performed thus far and all questions were answered to the best of my ability. The patient demonstrates understanding and agreement with plan.  [PR]    Clinical Course User Index [PR] Merlyn Lot, MD    The patient was evaluated in  Emergency Department today for the symptoms described in the history of present illness. He/she was evaluated in the context of the global COVID-19 pandemic, which necessitated consideration that the patient might be at risk for infection with the SARS-CoV-2 virus that causes COVID-19. Institutional protocols and algorithms that pertain to the evaluation of patients at risk for COVID-19 are in a state of rapid change based on information released by regulatory bodies including the CDC and federal and state organizations. These policies and algorithms were followed during the patient's care in the ED.  As part of my medical decision making, I reviewed the following data within the [facility] notes reviewed and incorporated, Labs reviewed, notes from prior ED visits and [place] Controlled Substance Database   ____________________________________________   FINAL CLINICAL IMPRESSION(S) / ED DIAGNOSES  Final diagnoses:  Closed dislocation of right hip, initial encounter ([place])      NEW MEDICATIONS STARTED DURING THIS VISIT:  New Prescriptions   No medications on file     Note:  This document was prepared using Dragon voice recognition software and may include unintentional dictation errors.    Merlyn Lot, MD 09/05/20 1121

## 2020-09-06 ENCOUNTER — Encounter: Payer: Self-pay | Admitting: Surgery

## 2020-09-06 DIAGNOSIS — Z96649 Presence of unspecified artificial hip joint: Secondary | ICD-10-CM | POA: Insufficient documentation

## 2020-09-06 DIAGNOSIS — Z96641 Presence of right artificial hip joint: Secondary | ICD-10-CM | POA: Insufficient documentation

## 2020-09-07 NOTE — Anesthesia Postprocedure Evaluation (Signed)
Anesthesia Post Note  Patient: Edwin Christensen  Procedure(s) Performed: CLOSED REDUCTION HIP (Right Hip)  Patient location during evaluation: PACU Anesthesia Type: General Level of consciousness: awake and alert and oriented Pain management: pain level controlled Vital Signs Assessment: post-procedure vital signs reviewed and stable Respiratory status: spontaneous breathing Cardiovascular status: blood pressure returned to baseline Anesthetic complications: no   No complications documented.   Last Vitals:  Vitals:   09/05/20 1448 09/05/20 1528  BP: (!) 147/78 124/75  Pulse: (!) 53 (!) 56  Resp: 16   Temp: 36.6 C   SpO2: 100% 100%    Last Pain:  Vitals:   09/06/20 0832  TempSrc:   PainSc: 0-No pain                 Axcel Horsch

## 2021-01-03 ENCOUNTER — Inpatient Hospital Stay: Payer: Medicare HMO

## 2021-01-03 ENCOUNTER — Inpatient Hospital Stay: Payer: Medicare HMO | Attending: Oncology | Admitting: Oncology

## 2021-01-03 ENCOUNTER — Encounter: Payer: Self-pay | Admitting: Oncology

## 2021-01-03 VITALS — BP 159/98 | HR 64 | Temp 97.0°F | Resp 16 | Wt 162.5 lb

## 2021-01-03 DIAGNOSIS — Z87891 Personal history of nicotine dependence: Secondary | ICD-10-CM | POA: Insufficient documentation

## 2021-01-03 DIAGNOSIS — D72819 Decreased white blood cell count, unspecified: Secondary | ICD-10-CM | POA: Insufficient documentation

## 2021-01-03 DIAGNOSIS — R5383 Other fatigue: Secondary | ICD-10-CM

## 2021-01-03 DIAGNOSIS — N189 Chronic kidney disease, unspecified: Secondary | ICD-10-CM | POA: Diagnosis not present

## 2021-01-03 DIAGNOSIS — D539 Nutritional anemia, unspecified: Secondary | ICD-10-CM | POA: Diagnosis not present

## 2021-01-03 DIAGNOSIS — Z79899 Other long term (current) drug therapy: Secondary | ICD-10-CM | POA: Insufficient documentation

## 2021-01-03 DIAGNOSIS — M069 Rheumatoid arthritis, unspecified: Secondary | ICD-10-CM | POA: Insufficient documentation

## 2021-01-03 DIAGNOSIS — D649 Anemia, unspecified: Secondary | ICD-10-CM

## 2021-01-03 DIAGNOSIS — F17210 Nicotine dependence, cigarettes, uncomplicated: Secondary | ICD-10-CM

## 2021-01-03 DIAGNOSIS — M2550 Pain in unspecified joint: Secondary | ICD-10-CM

## 2021-01-03 DIAGNOSIS — D472 Monoclonal gammopathy: Secondary | ICD-10-CM

## 2021-01-03 LAB — COMPREHENSIVE METABOLIC PANEL
ALT: 15 U/L (ref 0–44)
AST: 23 U/L (ref 15–41)
Albumin: 3.9 g/dL (ref 3.5–5.0)
Alkaline Phosphatase: 58 U/L (ref 38–126)
Anion gap: 8 (ref 5–15)
BUN: 28 mg/dL — ABNORMAL HIGH (ref 8–23)
CO2: 29 mmol/L (ref 22–32)
Calcium: 9 mg/dL (ref 8.9–10.3)
Chloride: 97 mmol/L — ABNORMAL LOW (ref 98–111)
Creatinine, Ser: 1.72 mg/dL — ABNORMAL HIGH (ref 0.61–1.24)
GFR, Estimated: 39 mL/min — ABNORMAL LOW (ref >60)
Glucose, Bld: 97 mg/dL (ref 70–99)
Potassium: 4.1 mmol/L (ref 3.5–5.1)
Sodium: 134 mmol/L — ABNORMAL LOW (ref 135–145)
Total Bilirubin: 1 mg/dL (ref 0.3–1.2)
Total Protein: 7 g/dL (ref 6.5–8.1)

## 2021-01-03 LAB — CBC WITH DIFFERENTIAL/PLATELET
Abs Immature Granulocytes: 0.21 10*3/uL — ABNORMAL HIGH (ref 0.00–0.07)
Basophils Absolute: 0 10*3/uL (ref 0.0–0.1)
Basophils Relative: 0 %
Eosinophils Absolute: 0.1 10*3/uL (ref 0.0–0.5)
Eosinophils Relative: 1 %
HCT: 32.5 % — ABNORMAL LOW (ref 39.0–52.0)
Hemoglobin: 10.8 g/dL — ABNORMAL LOW (ref 13.0–17.0)
Immature Granulocytes: 4 %
Lymphocytes Relative: 5 %
Lymphs Abs: 0.3 10*3/uL — ABNORMAL LOW (ref 0.7–4.0)
MCH: 36.4 pg — ABNORMAL HIGH (ref 26.0–34.0)
MCHC: 33.2 g/dL (ref 30.0–36.0)
MCV: 109.4 fL — ABNORMAL HIGH (ref 80.0–100.0)
Monocytes Absolute: 0.4 10*3/uL (ref 0.1–1.0)
Monocytes Relative: 7 %
Neutro Abs: 4.7 10*3/uL (ref 1.7–7.7)
Neutrophils Relative %: 83 %
Platelets: 128 10*3/uL — ABNORMAL LOW (ref 150–400)
RBC: 2.97 MIL/uL — ABNORMAL LOW (ref 4.22–5.81)
RDW: 12.5 % (ref 11.5–15.5)
WBC: 5.6 10*3/uL (ref 4.0–10.5)
nRBC: 0 % (ref 0.0–0.2)

## 2021-01-03 LAB — VITAMIN B12: Vitamin B-12: 640 pg/mL (ref 180–914)

## 2021-01-03 LAB — RETICULOCYTES
Immature Retic Fract: 9.1 % (ref 2.3–15.9)
RBC.: 3.04 MIL/uL — ABNORMAL LOW (ref 4.22–5.81)
Retic Count, Absolute: 51.7 10*3/uL (ref 19.0–186.0)
Retic Ct Pct: 1.7 % (ref 0.4–3.1)

## 2021-01-03 LAB — IRON AND TIBC
Iron: 83 ug/dL (ref 45–182)
Saturation Ratios: 40 % — ABNORMAL HIGH (ref 17.9–39.5)
TIBC: 207 ug/dL — ABNORMAL LOW (ref 250–450)
UIBC: 124 ug/dL

## 2021-01-03 LAB — TSH: TSH: 3.661 u[IU]/mL (ref 0.350–4.500)

## 2021-01-03 LAB — FERRITIN: Ferritin: 419 ng/mL — ABNORMAL HIGH (ref 24–336)

## 2021-01-03 LAB — FOLATE: Folate: 41 ng/mL (ref >5.9)

## 2021-01-03 NOTE — Progress Notes (Signed)
I connected with Edwin Christensen on 01/04/21 at 10:45 AM EDT by video enabled telemedicine visit and verified that I am speaking with the correct person using two identifiers.   I discussed the limitations, risks, security and privacy concerns of performing an evaluation and management service by telemedicine and the availability of in-person appointments. I also discussed with the patient that there may be a patient responsible charge related to this service. The patient expressed understanding and agreed to proceed.  Other persons participating in the visit and their role in the encounter:  none  Patient's location:  cancer center Provider's location:  home  Reason for referral: concern for MGUS Referring provider: DR. Jefm Bryant  History of present illness: Patient is [name] who follows up with rheumatology for rheumatoid arthritis.  He is presently on Humira Plaquenil and prednisone for the same.  Other past medical history also includes osteoporosis, CKD, anemia among other medical problems.  As a part of the work-up recently patient underwent SPEP which showed an M spike of 0.3 and immunofixation showed IgA monoclonal lambda light chain specificity.  Patient is here for further management.  Patient's hemoglobin was 9.7/27.8 with an MCV of 103.7.  Looking back at his prior CBCs his hemoglobin back in 2019 was close to 11 and then gradually drifted down to 9.5-10.5 over the last 2 years.  He has had some chronic macrocytosis.  Oates count mildly low at 3.7 and a platelet count of 140.  BMP shows creatinine of 1.76.  Total protein normal.  Calcium normal at 9.1.  Patient currently reports ongoing joint pain from his rheumatoid arthritis and some fatigue.  Denies any new aches and pains anywhere else.Appetite and weight have remained stable.   Review of Systems  Constitutional:  Positive for malaise/fatigue. Negative for chills, fever and weight loss.  HENT:  Negative for  congestion, ear discharge and nosebleeds.   Eyes:  Negative for blurred vision.  Respiratory:  Negative for cough, hemoptysis, sputum production, shortness of breath and wheezing.   Cardiovascular:  Negative for chest pain, palpitations, orthopnea and claudication.  Gastrointestinal:  Negative for abdominal pain, blood in stool, constipation, diarrhea, heartburn, melena, nausea and vomiting.  Genitourinary:  Negative for dysuria, flank pain, frequency, hematuria and urgency.  Musculoskeletal:  Positive for joint pain. Negative for back pain and myalgias.  Skin:  Negative for rash.  Neurological:  Negative for dizziness, tingling, focal weakness, seizures, weakness and headaches.  Endo/Heme/Allergies:  Does not bruise/bleed easily.  Psychiatric/Behavioral:  Negative for depression and suicidal ideas. The patient does not have insomnia.    No Known Allergies  Past Medical History:  Diagnosis Date   Anemia    Arthritis    rheumatoid, osteoarthritis   CHF (congestive heart failure) (HCC)    Chronic kidney disease    renal insufficiency   Coronary artery disease    GERD (gastroesophageal reflux disease)    Hypertension    Hyperuricemia    Hypothyroidism    Interstitial lung disease ([place])    Sleep apnea    mld - CPAP   Wears dentures    full upper, partial lower    Past Surgical History:  Procedure Laterality Date   CATARACT EXTRACTION W/PHACO Right 09/08/2017   Procedure: CATARACT EXTRACTION PHACO AND INTRAOCULAR LENS PLACEMENT ([place]) RIGHT;  Surgeon: Leandrew Koyanagi, MD;  Location: [facility];  Service: Ophthalmology;  Laterality: Right;  sleep apnea   CATARACT EXTRACTION W/PHACO Left 10/06/2017   Procedure: CATARACT EXTRACTION PHACO  AND INTRAOCULAR LENS PLACEMENT ([place]) LEFT;  Surgeon: Leandrew Koyanagi, MD;  Location: [place];  Service: Ophthalmology;  Laterality: Left;   CERVICAL FUSION     COLONOSCOPY     CORONARY ARTERY BYPASS GRAFT  1992    ESOPHAGOGASTRODUODENOSCOPY     GASTROC RECESSION EXTREMITY Left 07/05/2015   Procedure: GASTROC RECESSION EXTREMITY;  Surgeon: Albertine Patricia, DPM;  Location: [facility];  Service: Podiatry;  Laterality: Left;  LMA w/ local   HIP CLOSED REDUCTION Right 09/05/2020   Procedure: CLOSED REDUCTION HIP;  Surgeon: Corky Mull, MD;  Location: [facility];  Service: Orthopedics;  Laterality: Right;   JOINT REPLACEMENT Right    hip   OSTECTOMY Left 07/05/2015   Procedure: OSTECTOMY 4th & 5th metatarsals left foot;  Surgeon: Albertine Patricia, DPM;  Location: [facility];  Service: Podiatry;  Laterality: Left;  LMA w/ local    Social History   Socioeconomic History   Marital status: Married    Spouse name: Not on file   Number of children: Not on file   Years of education: Not on file   Highest education level: Not on file  Occupational History   Not on file  Tobacco Use   Smoking status: Former    Types: Cigarettes    Quit date: 06/23/1973    Years since quitting: 47.5   Smokeless tobacco: Never  Substance and Sexual Activity   Alcohol use: No   Drug use: No   Sexual activity: Not on file  Other Topics Concern   Not on file  Social History Narrative   Not on file   Social Determinants of Health   Financial Resource Strain: Not on file  Food Insecurity: Not on file  Transportation Needs: Not on file  Physical Activity: Not on file  Stress: Not on file  Social Connections: Not on file  Intimate Partner Violence: Not on file    History reviewed. No pertinent family history.   Current Outpatient Medications:    acetaminophen (TYLENOL) 325 MG tablet, Take 650 mg by mouth as needed., Disp: , Rfl:    aspirin 325 MG tablet, Take 325 mg by mouth daily., Disp: , Rfl:    azaTHIOprine (IMURAN) 50 MG tablet, Take 50 mg by mouth daily., Disp: , Rfl:    chlorthalidone (HYGROTON) 25 MG tablet, Take 25 mg by mouth daily., Disp: , Rfl:    cyanocobalamin (,VITAMIN B-12,) 1000 MCG/ML injection, Inject  1,000 mcg into the muscle every 30 (thirty) days., Disp: , Rfl:    finasteride (PROSCAR) 5 MG tablet, Take 5 mg by mouth daily., Disp: , Rfl:    furosemide (LASIX) 20 MG tablet, Take 20 mg by mouth 3 (three) times a week., Disp: , Rfl:    hydroxychloroquine (PLAQUENIL) 200 MG tablet, Take 200 mg by mouth daily., Disp: , Rfl:    levothyroxine (SYNTHROID, LEVOTHROID) 25 MCG tablet, Take 25 mcg by mouth daily before breakfast., Disp: , Rfl:    meclizine (ANTIVERT) 25 MG tablet, Take 1 tablet (25 mg total) 3 (three) times daily as needed by mouth for dizziness., Disp: 30 tablet, Rfl: 0   Multiple Vitamin (MULTIVITAMIN) tablet, Take 1 tablet by mouth daily., Disp: , Rfl:    omeprazole (PRILOSEC) 40 MG capsule, Take 40 mg by mouth daily., Disp: , Rfl:    potassium chloride SA (K-DUR,KLOR-CON) 20 MEQ tablet, Take 20 mEq by mouth 2 (two) times daily., Disp: , Rfl:    predniSONE (DELTASONE) 10 MG tablet, Take 10 mg  by mouth daily with breakfast., Disp: , Rfl:    simvastatin (ZOCOR) 40 MG tablet, Take 40 mg by mouth daily., Disp: , Rfl:    terazosin (HYTRIN) 2 MG capsule, Take 2 mg by mouth at bedtime., Disp: , Rfl:    traMADol (ULTRAM) 50 MG tablet, Take 1 tablet (50 mg total) by mouth every 6 (six) hours as needed for moderate pain or severe pain., Disp: 20 tablet, Rfl: 0  No results found.  No images are attached to the encounter.   CMP Latest Ref Rng & Units 01/03/2021  Glucose 70 - 99 mg/dL 97  BUN 8 - 23 mg/dL 28(H)  Creatinine 0.61 - 1.24 mg/dL 1.72(H)  Sodium 135 - 145 mmol/L 134(L)  Potassium 3.5 - 5.1 mmol/L 4.1  Chloride 98 - 111 mmol/L 97(L)  CO2 22 - 32 mmol/L 29  Calcium 8.9 - 10.3 mg/dL 9.0  Total Protein 6.5 - 8.1 g/dL 7.0  Total Bilirubin 0.3 - 1.2 mg/dL 1.0  Alkaline Phos 38 - 126 U/L 58  AST 15 - 41 U/L 23  ALT 0 - 44 U/L 15   CBC Latest Ref Rng & Units 01/03/2021  WBC 4.0 - 10.5 K/uL 5.6  Hemoglobin 13.0 - 17.0 g/dL 10.8(L)  Hematocrit 39.0 - 52.0 % 32.5(L)  Platelets  150 - 400 K/uL 128(L)     Observation/objective: Appears in no acute distress over video visit today.  Breathing is nonlabored  Assessment and plan: Patient is a 86 year old male referred for IgM lambda MGUS  Looking back at patient's prior CBCs patient has had a component of chronic macrocytic anemia  which is gradually trending down.  Mild leukopenia and thrombocytopenia as well.  Given his age this could be MDS as well.  He also has chronic kidney disease which could be contributing to his anemia to some extent.  Today I will do a complete anemia work-up including CBC ferritin and iron studies, B12 and folate, reticulocyte count, TSH, repeat myeloma panel and serum free light chains.  I will see him back in 2 weeks in person to discuss the results of blood work and further management  Follow-up instructions: As above  I discussed the assessment and treatment plan with the patient. The patient was provided an opportunity to ask questions and all were answered. The patient agreed with the plan and demonstrated an understanding of the instructions.   The patient was advised to call back or seek an in-person evaluation if the symptoms worsen or if the condition fails to improve as anticipated.  Visit Diagnosis: 1. MGUS (monoclonal gammopathy of unknown significance)   2. Normocytic anemia     Dr. Randa Evens, MD, MPH [facility] Tel- [phone] 01/04/2021 8:48 AM

## 2021-01-06 LAB — MULTIPLE MYELOMA PANEL, SERUM
Albumin SerPl Elph-Mcnc: 3.7 g/dL (ref 2.9–4.4)
Albumin/Glob SerPl: 1.4 (ref 0.7–1.7)
Alpha 1: 0.2 g/dL (ref 0.0–0.4)
Alpha2 Glob SerPl Elph-Mcnc: 0.7 g/dL (ref 0.4–1.0)
B-Globulin SerPl Elph-Mcnc: 1 g/dL (ref 0.7–1.3)
Gamma Glob SerPl Elph-Mcnc: 0.8 g/dL (ref 0.4–1.8)
Globulin, Total: 2.7 g/dL (ref 2.2–3.9)
IgA: 588 mg/dL — ABNORMAL HIGH (ref 61–437)
IgG (Immunoglobin G), Serum: 846 mg/dL (ref 603–1613)
IgM (Immunoglobulin M), Srm: 40 mg/dL (ref 15–143)
M Protein SerPl Elph-Mcnc: 0.4 g/dL — ABNORMAL HIGH
Total Protein ELP: 6.4 g/dL (ref 6.0–8.5)

## 2021-01-06 LAB — KAPPA/LAMBDA LIGHT CHAINS
Kappa free light chain: 56.9 mg/L — ABNORMAL HIGH (ref 3.3–19.4)
Kappa, lambda light chain ratio: 0.41 (ref 0.26–1.65)
Lambda free light chains: 137.5 mg/L — ABNORMAL HIGH (ref 5.7–26.3)

## 2021-01-07 LAB — PROTEIN ELECTRO, RANDOM URINE
Albumin ELP, Urine: 0 %
Alpha-1-Globulin, U: 0 %
Alpha-2-Globulin, U: 0 %
Beta Globulin, U: 0 %
Gamma Globulin, U: 0 %
Total Protein, Urine: <4 mg/dL

## 2021-01-21 ENCOUNTER — Other Ambulatory Visit: Payer: Self-pay | Admitting: *Deleted

## 2021-01-21 ENCOUNTER — Inpatient Hospital Stay: Payer: Medicare HMO | Attending: Oncology | Admitting: Oncology

## 2021-01-21 VITALS — BP 135/86 | HR 73 | Temp 97.1°F | Resp 16 | Wt 161.9 lb

## 2021-01-21 DIAGNOSIS — D539 Nutritional anemia, unspecified: Secondary | ICD-10-CM

## 2021-01-21 DIAGNOSIS — D472 Monoclonal gammopathy: Secondary | ICD-10-CM | POA: Diagnosis present

## 2021-01-21 DIAGNOSIS — D631 Anemia in chronic kidney disease: Secondary | ICD-10-CM | POA: Diagnosis not present

## 2021-01-21 DIAGNOSIS — M2550 Pain in unspecified joint: Secondary | ICD-10-CM | POA: Insufficient documentation

## 2021-01-21 DIAGNOSIS — Z87891 Personal history of nicotine dependence: Secondary | ICD-10-CM | POA: Diagnosis not present

## 2021-01-21 DIAGNOSIS — M810 Age-related osteoporosis without current pathological fracture: Secondary | ICD-10-CM | POA: Insufficient documentation

## 2021-01-21 DIAGNOSIS — D7589 Other specified diseases of blood and blood-forming organs: Secondary | ICD-10-CM | POA: Diagnosis not present

## 2021-01-21 DIAGNOSIS — R5383 Other fatigue: Secondary | ICD-10-CM | POA: Diagnosis not present

## 2021-01-21 DIAGNOSIS — D696 Thrombocytopenia, unspecified: Secondary | ICD-10-CM | POA: Insufficient documentation

## 2021-01-21 DIAGNOSIS — N183 Chronic kidney disease, stage 3 unspecified: Secondary | ICD-10-CM | POA: Insufficient documentation

## 2021-01-21 DIAGNOSIS — Z79899 Other long term (current) drug therapy: Secondary | ICD-10-CM | POA: Diagnosis not present

## 2021-01-21 DIAGNOSIS — D649 Anemia, unspecified: Secondary | ICD-10-CM

## 2021-01-21 DIAGNOSIS — M069 Rheumatoid arthritis, unspecified: Secondary | ICD-10-CM | POA: Diagnosis not present

## 2021-01-21 NOTE — Progress Notes (Signed)
Pt states here for follow up from blood work 2 weeks ago. Pt reports difficulty falling asleep and staying asleep.

## 2021-01-23 NOTE — Progress Notes (Signed)
Hematology/Oncology Consult note [facility]  [phone] [phone]  Patient Care Team: Chelsea Primus, MD as PCP - General (Internal Medicine)   Name of the patient: Edwin Christensen  [id number]  [birth year]   Date of visit: 01/23/21  Diagnosis-IgA lambda MGUS  Chief complaint/ Reason for visit-discuss results of blood work  Heme/Onc history: Patient is [name] who follows up with rheumatology for rheumatoid arthritis.  He is presently on Humira Plaquenil and prednisone for the same.  Other past medical history also includes osteoporosis, CKD, anemia among other medical problems.  As a part of the work-up recently patient underwent SPEP which showed an M spike of 0.3 and immunofixation showed IgA monoclonal lambda light chain specificity.  Patient is here for further management.  Patient's hemoglobin was 9.7/27.8 with an MCV of 103.7.  Looking back at his prior CBCs his hemoglobin back in 2019 was close to 11 and then gradually drifted down to 9.5-10.5 over the last 2 years.  He has had some chronic macrocytosis.  [place] count mildly low at 3.7 and a platelet count of 140.  BMP shows creatinine of 1.76.  Total protein normal.  Calcium normal at 9.1.   Results of blood work from 01/03/2021 were as follows: CBC showed Stickle count of 5.6, H&H of 10.8/32.5 with an MCV of 109 and a platelet count of 128.  CMP was significant for an elevated serum creatinine of 1.7 and a normal calcium level of 9 with a total protein that was normal at 7.  Myeloma panel showed 0.4 g of M protein IgA lambda with IgA level that was mildly elevated at 588.  Both kappa and lambda light chains were elevated with a ratio of 0.4.  Ferritin levels mildly elevated at 419.  Iron saturation 40%.  B12 level normal at 640.  Folate normal at 41.  TSH normal.  Reticulocyte count low for the degree of anemia at 1.7%.  Interval history-patient has baseline fatigue  and joint pain from his rheumatoid arthritis.  Denies any new complaints at this time  ECOG PS- 1 Pain scale- 3   Review of systems- Review of Systems  Constitutional:  Positive for malaise/fatigue. Negative for chills, fever and weight loss.  HENT:  Negative for congestion, ear discharge and nosebleeds.   Eyes:  Negative for blurred vision.  Respiratory:  Negative for cough, hemoptysis, sputum production, shortness of breath and wheezing.   Cardiovascular:  Negative for chest pain, palpitations, orthopnea and claudication.  Gastrointestinal:  Negative for abdominal pain, blood in stool, constipation, diarrhea, heartburn, melena, nausea and vomiting.  Genitourinary:  Negative for dysuria, flank pain, frequency, hematuria and urgency.  Musculoskeletal:  Positive for joint pain. Negative for back pain and myalgias.  Skin:  Negative for rash.  Neurological:  Negative for dizziness, tingling, focal weakness, seizures, weakness and headaches.  Endo/Heme/Allergies:  Does not bruise/bleed easily.  Psychiatric/Behavioral:  Negative for depression and suicidal ideas. The patient does not have insomnia.       Allergies  Allergen Reactions   Methotrexate Derivatives     "Reaction in lungs"     Past Medical History:  Diagnosis Date   Anemia    Arthritis    rheumatoid, osteoarthritis   CHF (congestive heart failure) (HCC)    Chronic kidney disease    renal insufficiency   Coronary artery disease    GERD (gastroesophageal reflux disease)    Hypertension    Hyperuricemia    Hypothyroidism  Interstitial lung disease ([place])    Sleep apnea    mld - CPAP   Wears dentures    full upper, partial lower     Past Surgical History:  Procedure Laterality Date   CATARACT EXTRACTION W/PHACO Right 09/08/2017   Procedure: CATARACT EXTRACTION PHACO AND INTRAOCULAR LENS PLACEMENT ([place]) RIGHT;  Surgeon: Leandrew Koyanagi, MD;  Location: [place];  Service: Ophthalmology;   Laterality: Right;  sleep apnea   CATARACT EXTRACTION W/PHACO Left 10/06/2017   Procedure: CATARACT EXTRACTION PHACO AND INTRAOCULAR LENS PLACEMENT ([place]) LEFT;  Surgeon: Leandrew Koyanagi, MD;  Location: [place];  Service: Ophthalmology;  Laterality: Left;   CERVICAL FUSION     COLONOSCOPY     CORONARY ARTERY BYPASS GRAFT  1992   ESOPHAGOGASTRODUODENOSCOPY     GASTROC RECESSION EXTREMITY Left 07/05/2015   Procedure: GASTROC RECESSION EXTREMITY;  Surgeon: Albertine Patricia, DPM;  Location: [facility];  Service: Podiatry;  Laterality: Left;  LMA w/ local   HIP CLOSED REDUCTION Right 09/05/2020   Procedure: CLOSED REDUCTION HIP;  Surgeon: Corky Mull, MD;  Location: [facility];  Service: Orthopedics;  Laterality: Right;   JOINT REPLACEMENT Right    hip   OSTECTOMY Left 07/05/2015   Procedure: OSTECTOMY 4th & 5th metatarsals left foot;  Surgeon: Albertine Patricia, DPM;  Location: [facility];  Service: Podiatry;  Laterality: Left;  LMA w/ local    Social History   Socioeconomic History   Marital status: Married    Spouse name: Not on file   Number of children: Not on file   Years of education: Not on file   Highest education level: Not on file  Occupational History   Not on file  Tobacco Use   Smoking status: Former    Types: Cigarettes    Quit date: 06/23/1973    Years since quitting: 47.6   Smokeless tobacco: Never  Substance and Sexual Activity   Alcohol use: No   Drug use: No   Sexual activity: Not on file  Other Topics Concern   Not on file  Social History Narrative   Not on file   Social Determinants of Health   Financial Resource Strain: Not on file  Food Insecurity: Not on file  Transportation Needs: Not on file  Physical Activity: Not on file  Stress: Not on file  Social Connections: Not on file  Intimate Partner Violence: Not on file    No family history on file.   Current Outpatient Medications:    acetaminophen (TYLENOL) 325 MG tablet, Take 650 mg by  mouth as needed., Disp: , Rfl:    aspirin 325 MG tablet, Take 325 mg by mouth daily., Disp: , Rfl:    azaTHIOprine (IMURAN) 50 MG tablet, Take 50 mg by mouth daily., Disp: , Rfl:    chlorthalidone (HYGROTON) 25 MG tablet, Take 25 mg by mouth daily., Disp: , Rfl:    cyanocobalamin (,VITAMIN B-12,) 1000 MCG/ML injection, Inject 1,000 mcg into the muscle every 30 (thirty) days., Disp: , Rfl:    finasteride (PROSCAR) 5 MG tablet, Take 5 mg by mouth daily., Disp: , Rfl:    furosemide (LASIX) 20 MG tablet, Take 20 mg by mouth 3 (three) times a week., Disp: , Rfl:    hydroxychloroquine (PLAQUENIL) 200 MG tablet, Take 200 mg by mouth daily., Disp: , Rfl:    levothyroxine (SYNTHROID, LEVOTHROID) 25 MCG tablet, Take 25 mcg by mouth daily before breakfast., Disp: , Rfl:    Multiple Vitamin (MULTIVITAMIN) tablet,  Take 1 tablet by mouth daily., Disp: , Rfl:    omeprazole (PRILOSEC) 40 MG capsule, Take 40 mg by mouth daily., Disp: , Rfl:    potassium chloride SA (K-DUR,KLOR-CON) 20 MEQ tablet, Take 20 mEq by mouth daily., Disp: , Rfl:    predniSONE (DELTASONE) 10 MG tablet, Take 10 mg by mouth daily with breakfast., Disp: , Rfl:    simvastatin (ZOCOR) 40 MG tablet, Take 40 mg by mouth daily., Disp: , Rfl:    terazosin (HYTRIN) 2 MG capsule, Take 2 mg by mouth at bedtime., Disp: , Rfl:    traMADol (ULTRAM) 50 MG tablet, Take 1 tablet (50 mg total) by mouth every 6 (six) hours as needed for moderate pain or severe pain., Disp: 20 tablet, Rfl: 0   meclizine (ANTIVERT) 25 MG tablet, Take 1 tablet (25 mg total) 3 (three) times daily as needed by mouth for dizziness. (Patient not taking: Reported on 01/21/2021), Disp: 30 tablet, Rfl: 0  Physical exam:  Vitals:   01/21/21 1430  BP: 135/86  Pulse: 73  Resp: 16  Temp: (!) 97.1 F (36.2 C)  SpO2: 100%  Weight: 161 lb 14.4 oz (73.4 kg)   Physical Exam Constitutional:      General: He is not in acute distress. Cardiovascular:     Rate and Rhythm: Normal rate  and regular rhythm.     Heart sounds: Normal heart sounds.  Pulmonary:     Effort: Pulmonary effort is normal.     Breath sounds: Normal breath sounds.  Abdominal:     General: Bowel sounds are normal.     Palpations: Abdomen is soft.  Skin:    General: Skin is warm and dry.  Neurological:     Mental Status: He is alert and oriented to person, place, and time.     CMP Latest Ref Rng & Units 01/03/2021  Glucose 70 - 99 mg/dL 97  BUN 8 - 23 mg/dL 28(H)  Creatinine 0.61 - 1.24 mg/dL 1.72(H)  Sodium 135 - 145 mmol/L 134(L)  Potassium 3.5 - 5.1 mmol/L 4.1  Chloride 98 - 111 mmol/L 97(L)  CO2 22 - 32 mmol/L 29  Calcium 8.9 - 10.3 mg/dL 9.0  Total Protein 6.5 - 8.1 g/dL 7.0  Total Bilirubin 0.3 - 1.2 mg/dL 1.0  Alkaline Phos 38 - 126 U/L 58  AST 15 - 41 U/L 23  ALT 0 - 44 U/L 15   CBC Latest Ref Rng & Units 01/03/2021  WBC 4.0 - 10.5 K/uL 5.6  Hemoglobin 13.0 - 17.0 g/dL 10.8(L)  Hematocrit 39.0 - 52.0 % 32.5(L)  Platelets 150 - 400 K/uL 128(L)     Assessment and plan- Patient is a 86 y.o. male referred for concern of MGUS  Patient's hemoglobin has remained stable between 10-11 for the last 5 years.  Reversible causes of anemia including iron studies B12 folate were all unremarkable.  He does have a small M protein of 0.4 IgA lambda which is consistent with MGUS.  Both kappa and lambda light chains were elevated which can be seen in chronic kidney disease within normal free light chain ratio of 0.4.  I therefore do not feel that his MGUS is contributing to his anemia which is overall stable as well as his kidney disease.  Serum calcium and total protein are normal.  At this time I am inclined to monitor his MGUS conservatively with repeat labs in 3 months in 6 months and I will see him back in 6 months.  Dr. Peterson Ao can consider referring him to nephrology given his CKD however that seems to be stable at least over the last 3 years.  Discussed differences between MGUS smoldering  myeloma and overt multiple myeloma.  That MGUS has not required any treatment.  Risk of transformation of MGUS to multiple myeloma is about 1 %/year.  Mild thrombocytopenia: Continue to monitor   Visit Diagnosis 1. Macrocytic anemia   2. Anemia of chronic kidney failure, stage 3 (moderate) (HCC)      Dr. Randa Evens, MD, MPH [facility] at [facility] [phone] 01/23/2021 10:44 AM

## 2021-04-22 ENCOUNTER — Inpatient Hospital Stay: Payer: Medicare HMO | Attending: Oncology

## 2021-04-22 ENCOUNTER — Other Ambulatory Visit: Payer: Self-pay

## 2021-04-22 DIAGNOSIS — D649 Anemia, unspecified: Secondary | ICD-10-CM | POA: Insufficient documentation

## 2021-04-22 DIAGNOSIS — D472 Monoclonal gammopathy: Secondary | ICD-10-CM | POA: Diagnosis not present

## 2021-04-22 LAB — CBC WITH DIFFERENTIAL/PLATELET
Abs Immature Granulocytes: 0.01 10*3/uL (ref 0.00–0.07)
Basophils Absolute: 0 10*3/uL (ref 0.0–0.1)
Basophils Relative: 1 %
Eosinophils Absolute: 0.1 10*3/uL (ref 0.0–0.5)
Eosinophils Relative: 3 %
HCT: 30 % — ABNORMAL LOW (ref 39.0–52.0)
Hemoglobin: 9.8 g/dL — ABNORMAL LOW (ref 13.0–17.0)
Immature Granulocytes: 0 %
Lymphocytes Relative: 10 %
Lymphs Abs: 0.4 10*3/uL — ABNORMAL LOW (ref 0.7–4.0)
MCH: 35.8 pg — ABNORMAL HIGH (ref 26.0–34.0)
MCHC: 32.7 g/dL (ref 30.0–36.0)
MCV: 109.5 fL — ABNORMAL HIGH (ref 80.0–100.0)
Monocytes Absolute: 0.4 10*3/uL (ref 0.1–1.0)
Monocytes Relative: 10 %
Neutro Abs: 3.2 10*3/uL (ref 1.7–7.7)
Neutrophils Relative %: 76 %
Platelets: 142 10*3/uL — ABNORMAL LOW (ref 150–400)
RBC: 2.74 MIL/uL — ABNORMAL LOW (ref 4.22–5.81)
RDW: 12.9 % (ref 11.5–15.5)
WBC: 4.1 10*3/uL (ref 4.0–10.5)
nRBC: 0 % (ref 0.0–0.2)

## 2021-07-15 ENCOUNTER — Other Ambulatory Visit: Payer: Self-pay | Admitting: *Deleted

## 2021-07-15 DIAGNOSIS — D472 Monoclonal gammopathy: Secondary | ICD-10-CM

## 2021-07-15 DIAGNOSIS — D649 Anemia, unspecified: Secondary | ICD-10-CM

## 2021-07-15 DIAGNOSIS — N183 Chronic kidney disease, stage 3 unspecified: Secondary | ICD-10-CM

## 2021-07-15 DIAGNOSIS — D539 Nutritional anemia, unspecified: Secondary | ICD-10-CM

## 2021-07-15 DIAGNOSIS — D631 Anemia in chronic kidney disease: Secondary | ICD-10-CM

## 2021-07-20 ENCOUNTER — Other Ambulatory Visit: Payer: Self-pay | Admitting: *Deleted

## 2021-07-20 DIAGNOSIS — D649 Anemia, unspecified: Secondary | ICD-10-CM

## 2021-07-21 ENCOUNTER — Inpatient Hospital Stay: Payer: Medicare HMO | Attending: Oncology

## 2021-07-21 ENCOUNTER — Encounter: Payer: Self-pay | Admitting: Oncology

## 2021-07-21 ENCOUNTER — Inpatient Hospital Stay: Payer: Medicare HMO | Admitting: Oncology

## 2021-07-21 ENCOUNTER — Other Ambulatory Visit: Payer: Self-pay

## 2021-07-21 VITALS — BP 129/89 | HR 76 | Temp 97.2°F | Resp 18 | Wt 159.3 lb

## 2021-07-21 DIAGNOSIS — D539 Nutritional anemia, unspecified: Secondary | ICD-10-CM | POA: Diagnosis not present

## 2021-07-21 DIAGNOSIS — R768 Other specified abnormal immunological findings in serum: Secondary | ICD-10-CM | POA: Insufficient documentation

## 2021-07-21 DIAGNOSIS — D472 Monoclonal gammopathy: Secondary | ICD-10-CM | POA: Diagnosis not present

## 2021-07-21 DIAGNOSIS — Z87891 Personal history of nicotine dependence: Secondary | ICD-10-CM | POA: Insufficient documentation

## 2021-07-21 DIAGNOSIS — R5383 Other fatigue: Secondary | ICD-10-CM | POA: Insufficient documentation

## 2021-07-21 DIAGNOSIS — M069 Rheumatoid arthritis, unspecified: Secondary | ICD-10-CM | POA: Insufficient documentation

## 2021-07-21 DIAGNOSIS — D649 Anemia, unspecified: Secondary | ICD-10-CM | POA: Diagnosis not present

## 2021-07-21 DIAGNOSIS — M810 Age-related osteoporosis without current pathological fracture: Secondary | ICD-10-CM | POA: Diagnosis not present

## 2021-07-21 DIAGNOSIS — G8929 Other chronic pain: Secondary | ICD-10-CM | POA: Diagnosis not present

## 2021-07-21 DIAGNOSIS — R7989 Other specified abnormal findings of blood chemistry: Secondary | ICD-10-CM | POA: Insufficient documentation

## 2021-07-21 DIAGNOSIS — N183 Chronic kidney disease, stage 3 unspecified: Secondary | ICD-10-CM | POA: Diagnosis not present

## 2021-07-21 DIAGNOSIS — Z79899 Other long term (current) drug therapy: Secondary | ICD-10-CM | POA: Insufficient documentation

## 2021-07-21 DIAGNOSIS — Z888 Allergy status to other drugs, medicaments and biological substances status: Secondary | ICD-10-CM | POA: Insufficient documentation

## 2021-07-21 LAB — CBC WITH DIFFERENTIAL/PLATELET
Abs Immature Granulocytes: 0.02 10*3/uL (ref 0.00–0.07)
Basophils Absolute: 0 10*3/uL (ref 0.0–0.1)
Basophils Relative: 1 %
Eosinophils Absolute: 0.1 10*3/uL (ref 0.0–0.5)
Eosinophils Relative: 2 %
HCT: 31.6 % — ABNORMAL LOW (ref 39.0–52.0)
Hemoglobin: 10.5 g/dL — ABNORMAL LOW (ref 13.0–17.0)
Immature Granulocytes: 1 %
Lymphocytes Relative: 7 %
Lymphs Abs: 0.3 10*3/uL — ABNORMAL LOW (ref 0.7–4.0)
MCH: 35.7 pg — ABNORMAL HIGH (ref 26.0–34.0)
MCHC: 33.2 g/dL (ref 30.0–36.0)
MCV: 107.5 fL — ABNORMAL HIGH (ref 80.0–100.0)
Monocytes Absolute: 0.6 10*3/uL (ref 0.1–1.0)
Monocytes Relative: 14 %
Neutro Abs: 3.4 10*3/uL (ref 1.7–7.7)
Neutrophils Relative %: 75 %
Platelets: 136 10*3/uL — ABNORMAL LOW (ref 150–400)
RBC: 2.94 MIL/uL — ABNORMAL LOW (ref 4.22–5.81)
RDW: 13.2 % (ref 11.5–15.5)
WBC: 4.4 10*3/uL (ref 4.0–10.5)
nRBC: 0 % (ref 0.0–0.2)

## 2021-07-21 LAB — VITAMIN B12: Vitamin B-12: 1255 pg/mL — ABNORMAL HIGH (ref 180–914)

## 2021-07-21 LAB — IRON AND TIBC
Iron: 41 ug/dL — ABNORMAL LOW (ref 45–182)
Saturation Ratios: 24 % (ref 17.9–39.5)
TIBC: 172 ug/dL — ABNORMAL LOW (ref 250–450)
UIBC: 131 ug/dL

## 2021-07-21 LAB — TSH: TSH: 5.451 u[IU]/mL — ABNORMAL HIGH (ref 0.350–4.500)

## 2021-07-21 LAB — FERRITIN: Ferritin: 372 ng/mL — ABNORMAL HIGH (ref 24–336)

## 2021-07-21 NOTE — Progress Notes (Signed)
Hematology/Oncology Consult note [facility]  [phone] [phone]  Patient Care Team: Chelsea Primus, MD as PCP - General (Internal Medicine)   Name of the patient: Edwin Christensen  [id number]  November 27, 1934   Date of visit: 07/21/21  Diagnosis- IgA lambda MGUS  Chief complaint/ Reason for visit-routine follow-up of MGUS  Heme/Onc history: Patient is a 87 year old African-American male who follows up with rheumatology for rheumatoid arthritis.  He is presently on Humira Plaquenil and prednisone for the same.  Other past medical history also includes osteoporosis, CKD, anemia among other medical problems.  As a part of the work-up recently patient underwent SPEP which showed an M spike of 0.3 and immunofixation showed IgA monoclonal lambda light chain specificity.  Patient is here for further management.  Patient's hemoglobin was 9.7/27.8 with an MCV of 103.7.  Looking back at his prior CBCs his hemoglobin back in 2019 was close to 11 and then gradually drifted down to 9.5-10.5 over the last 2 years.  He has had some chronic macrocytosis.  Tyer count mildly low at 3.7 and a platelet count of 140.  BMP shows creatinine of 1.76.  Total protein normal.  Calcium normal at 9.1.   Results of blood work from 01/03/2021 were as follows: CBC showed Loyer count of 5.6, H&H of 10.8/32.5 with an MCV of 109 and a platelet count of 128.  CMP was significant for an elevated serum creatinine of 1.7 and a normal calcium level of 9 with a total protein that was normal at 7.  Myeloma panel showed 0.4 g of M protein IgA lambda with IgA level that was mildly elevated at 588.  Both kappa and lambda light chains were elevated with a ratio of 0.4.  Ferritin levels mildly elevated at 419.  Iron saturation 40%.  B12 level normal at 640.  Folate normal at 41.  TSH normal.  Reticulocyte count low for the degree of anemia at 1.7%.    Interval history-patient is doing well for his  age.  He has baseline fatigue as well as chronic joint pain which is unchanged.  No new complaints at this time  ECOG PS- 1 Pain scale- 0   Review of systems- Review of Systems  Constitutional:  Positive for malaise/fatigue. Negative for chills, fever and weight loss.  HENT:  Negative for congestion, ear discharge and nosebleeds.   Eyes:  Negative for blurred vision.  Respiratory:  Negative for cough, hemoptysis, sputum production, shortness of breath and wheezing.   Cardiovascular:  Negative for chest pain, palpitations, orthopnea and claudication.  Gastrointestinal:  Negative for abdominal pain, blood in stool, constipation, diarrhea, heartburn, melena, nausea and vomiting.  Genitourinary:  Negative for dysuria, flank pain, frequency, hematuria and urgency.  Musculoskeletal:  Positive for joint pain. Negative for back pain and myalgias.  Skin:  Negative for rash.  Neurological:  Negative for dizziness, tingling, focal weakness, seizures, weakness and headaches.  Endo/Heme/Allergies:  Does not bruise/bleed easily.  Psychiatric/Behavioral:  Negative for depression and suicidal ideas. The patient does not have insomnia.      Allergies  Allergen Reactions   Methotrexate Derivatives     "Reaction in lungs"   Amlodipine Other (See Comments)    Peripheral edema   Atenolol     Developed orthostasis with concomitant administration with terazosin 87m      Past Medical History:  Diagnosis Date   Anemia    Arthritis    rheumatoid, osteoarthritis   CHF (congestive heart  failure) (HCC)    Chronic kidney disease    renal insufficiency   Coronary artery disease    GERD (gastroesophageal reflux disease)    Hypertension    Hyperuricemia    Hypothyroidism    Interstitial lung disease (HCC)    Sleep apnea    mld - CPAP   Wears dentures    full upper, partial lower     Past Surgical History:  Procedure Laterality Date   CATARACT EXTRACTION W/PHACO Right 09/08/2017   Procedure:  CATARACT EXTRACTION PHACO AND INTRAOCULAR LENS PLACEMENT ([place]) RIGHT;  Surgeon: Leandrew Koyanagi, MD;  Location: [place];  Service: Ophthalmology;  Laterality: Right;  sleep apnea   CATARACT EXTRACTION W/PHACO Left 10/06/2017   Procedure: CATARACT EXTRACTION PHACO AND INTRAOCULAR LENS PLACEMENT ([place]) LEFT;  Surgeon: Leandrew Koyanagi, MD;  Location: [place];  Service: Ophthalmology;  Laterality: Left;   CERVICAL FUSION     COLONOSCOPY     CORONARY ARTERY BYPASS GRAFT  1992   ESOPHAGOGASTRODUODENOSCOPY     GASTROC RECESSION EXTREMITY Left 07/05/2015   Procedure: GASTROC RECESSION EXTREMITY;  Surgeon: Albertine Patricia, DPM;  Location: [facility];  Service: Podiatry;  Laterality: Left;  LMA w/ local   HIP CLOSED REDUCTION Right 09/05/2020   Procedure: CLOSED REDUCTION HIP;  Surgeon: Corky Mull, MD;  Location: [facility];  Service: Orthopedics;  Laterality: Right;   JOINT REPLACEMENT Right    hip   OSTECTOMY Left 07/05/2015   Procedure: OSTECTOMY 4th & 5th metatarsals left foot;  Surgeon: Albertine Patricia, DPM;  Location: [facility];  Service: Podiatry;  Laterality: Left;  LMA w/ local    Social History   Socioeconomic History   Marital status: Married    Spouse name: Not on file   Number of children: Not on file   Years of education: Not on file   Highest education level: Not on file  Occupational History   Not on file  Tobacco Use   Smoking status: Former    Types: Cigarettes    Quit date: 06/23/1973    Years since quitting: 48.1   Smokeless tobacco: Never  Substance and Sexual Activity   Alcohol use: No   Drug use: No   Sexual activity: Not on file  Other Topics Concern   Not on file  Social History Narrative   Not on file   Social Determinants of Health   Financial Resource Strain: Not on file  Food Insecurity: Not on file  Transportation Needs: Not on file  Physical Activity: Not on file  Stress: Not on file  Social Connections: Not on file   Intimate Partner Violence: Not on file    History reviewed. No pertinent family history.   Current Outpatient Medications:    acetaminophen (TYLENOL) 325 MG tablet, Take by mouth., Disp: , Rfl:    aspirin 81 MG EC tablet, Take by mouth., Disp: , Rfl:    atorvastatin (LIPITOR) 10 MG tablet, Take 1 tablet by mouth daily., Disp: , Rfl:    azaTHIOprine (IMURAN) 50 MG tablet, Take 50 mg by mouth daily., Disp: , Rfl:    chlorthalidone (HYGROTON) 25 MG tablet, Take 25 mg by mouth daily., Disp: , Rfl:    cyanocobalamin (,VITAMIN B-12,) 1000 MCG/ML injection, Inject 1,000 mcg into the muscle every 30 (thirty) days., Disp: , Rfl:    finasteride (PROSCAR) 5 MG tablet, Take 5 mg by mouth daily., Disp: , Rfl:    furosemide (LASIX) 20 MG tablet, Take 20 mg by mouth  3 (three) times a week., Disp: , Rfl:    hydroxychloroquine (PLAQUENIL) 200 MG tablet, Take 200 mg by mouth daily., Disp: , Rfl:    levothyroxine (SYNTHROID, LEVOTHROID) 25 MCG tablet, Take 25 mcg by mouth daily before breakfast., Disp: , Rfl:    Multiple Vitamin (MULTIVITAMIN) tablet, Take 1 tablet by mouth daily., Disp: , Rfl:    omeprazole (PRILOSEC) 40 MG capsule, Take 40 mg by mouth daily., Disp: , Rfl:    potassium chloride SA (K-DUR,KLOR-CON) 20 MEQ tablet, Take 20 mEq by mouth daily., Disp: , Rfl:    predniSONE (DELTASONE) 10 MG tablet, Take 10 mg by mouth daily with breakfast., Disp: , Rfl:    simvastatin (ZOCOR) 40 MG tablet, Take 40 mg by mouth daily., Disp: , Rfl:    terazosin (HYTRIN) 2 MG capsule, Take 2 mg by mouth at bedtime., Disp: , Rfl:    traMADol (ULTRAM) 50 MG tablet, Take 1 tablet (50 mg total) by mouth every 6 (six) hours as needed for moderate pain or severe pain., Disp: 20 tablet, Rfl: 0   aspirin 325 MG tablet, Take 325 mg by mouth daily. (Patient not taking: Reported on 07/21/2021), Disp: , Rfl:    meclizine (ANTIVERT) 25 MG tablet, Take 1 tablet (25 mg total) 3 (three) times daily as needed by mouth for dizziness.  (Patient not taking: Reported on 01/21/2021), Disp: 30 tablet, Rfl: 0  Physical exam:  Vitals:   07/21/21 1110  BP: 129/89  Pulse: 76  Resp: 18  Temp: (!) 97.2 F (36.2 C)  SpO2: 100%  Weight: 159 lb 4.8 oz (72.3 kg)   Physical Exam Constitutional:      General: He is not in acute distress.    Comments: Ambulates with a cane  Cardiovascular:     Rate and Rhythm: Normal rate and regular rhythm.     Heart sounds: Normal heart sounds.  Pulmonary:     Effort: Pulmonary effort is normal.     Breath sounds: Normal breath sounds.  Abdominal:     General: Bowel sounds are normal.     Palpations: Abdomen is soft.  Skin:    General: Skin is warm and dry.  Neurological:     Mental Status: He is alert and oriented to person, place, and time.     CMP Latest Ref Rng & Units 01/03/2021  Glucose 70 - 99 mg/dL 97  BUN 8 - 23 mg/dL 28(H)  Creatinine 0.61 - 1.24 mg/dL 1.72(H)  Sodium 135 - 145 mmol/L 134(L)  Potassium 3.5 - 5.1 mmol/L 4.1  Chloride 98 - 111 mmol/L 97(L)  CO2 22 - 32 mmol/L 29  Calcium 8.9 - 10.3 mg/dL 9.0  Total Protein 6.5 - 8.1 g/dL 7.0  Total Bilirubin 0.3 - 1.2 mg/dL 1.0  Alkaline Phos 38 - 126 U/L 58  AST 15 - 41 U/L 23  ALT 0 - 44 U/L 15   CBC Latest Ref Rng & Units 07/21/2021  WBC 4.0 - 10.5 K/uL 4.4  Hemoglobin 13.0 - 17.0 g/dL 10.5(L)  Hematocrit 39.0 - 52.0 % 31.6(L)  Platelets 150 - 400 K/uL 136(L)     Assessment and plan- Patient is a 87 y.o. male who is here for follow-up of normocytic anemia and MGUS  IgA MGUS:Myeloma panel and serum free light chains from today are pending.  His prior labs have shown a small amount of 0.4 g of IgM monoclonal protein which we will continue to monitor.  Serum free light chain ratio was  within normal limits at 0.4 likely secondary to his CKD which we are also monitoring.  Repeat labs again in 6 months and I will see him thereafter.  Macrocytic anemia: Patient does have someEvidence of macrocytic anemia which has been  fluctuating around 10 for the last 10 months with mild macrocytosis as well as thrombocytopenia.  This could be early MDS especially given his age.  Given the stability of his counts I am inclined to monitor this without getting a bone marrow biopsy at this time   Visit Diagnosis 1. Macrocytic anemia   2. MGUS (monoclonal gammopathy of unknown significance)      Dr. Randa Evens, MD, MPH [facility] at [facility] [phone] 07/21/2021 3:23 PM

## 2021-07-22 LAB — KAPPA/LAMBDA LIGHT CHAINS
Kappa free light chain: 68.8 mg/L — ABNORMAL HIGH (ref 3.3–19.4)
Kappa, lambda light chain ratio: 0.45 (ref 0.26–1.65)
Lambda free light chains: 154.2 mg/L — ABNORMAL HIGH (ref 5.7–26.3)

## 2021-07-24 LAB — MULTIPLE MYELOMA PANEL, SERUM
Albumin SerPl Elph-Mcnc: 3.3 g/dL (ref 2.9–4.4)
Albumin/Glob SerPl: 1.3 (ref 0.7–1.7)
Alpha 1: 0.3 g/dL (ref 0.0–0.4)
Alpha2 Glob SerPl Elph-Mcnc: 0.7 g/dL (ref 0.4–1.0)
B-Globulin SerPl Elph-Mcnc: 0.9 g/dL (ref 0.7–1.3)
Gamma Glob SerPl Elph-Mcnc: 0.7 g/dL (ref 0.4–1.8)
Globulin, Total: 2.7 g/dL (ref 2.2–3.9)
IgA: 581 mg/dL — ABNORMAL HIGH (ref 61–437)
IgG (Immunoglobin G), Serum: 805 mg/dL (ref 603–1613)
IgM (Immunoglobulin M), Srm: 43 mg/dL (ref 15–143)
M Protein SerPl Elph-Mcnc: 0.4 g/dL — ABNORMAL HIGH
Total Protein ELP: 6 g/dL (ref 6.0–8.5)

## 2022-01-20 ENCOUNTER — Inpatient Hospital Stay: Payer: Medicare HMO

## 2022-01-20 ENCOUNTER — Inpatient Hospital Stay: Payer: Medicare HMO | Admitting: Oncology

## 2022-06-04 ENCOUNTER — Telehealth: Payer: Self-pay | Admitting: *Deleted

## 2022-06-04 NOTE — Telephone Encounter (Signed)
Edwin Defoor, PA, [facility] call asking for Dr Janese Banks to call hiim back regarding this patient who has been on Prednisone or a long time and wanting to come off of it. Needs your input please, Return his call.  1. Rheumatoid arthritis-moderate Stage 2. Well controlled and Stable. Discussed potential long-term risks and benefits of prednisone with the patient, including but not limited to increased risks of diabetes, osteoporosis, cataracts, glaucoma, and avascular necrosis. After discussion of risks of continued use of prednisone, he is interested in starting a biologic such as Cimzia in an attempt to taper off of prednisone subsequently. We Edwin contact his pulmonologist and hematologist to collaborate on this possible medication change and if they concur with the decision, begin the authorization process for Cimzia.   Risks, benefits, and alternatives to treatment with Cimzia discussed with patient. Discussed side effects including site reactions, increased risk of infections, and increased risk of certain malignancies. Advised that if patient has a history of skin cancer yearly skin checks by a dermatologist are important while taking this medication to screen for recurrence. Patient denies history of seizures, multiple sclerosis, and heart failure; discussed that all of those conditions could be worsened by treatment with TNF inhibitors. Edwin test for Hepatitis B, Hepatitis C, and TB before initiating treatment. Patient was counseled to hold the medication when febrile or with signs/symptoms of infection.

## 2022-06-04 NOTE — Telephone Encounter (Signed)
Hassan Rowan- please give them my cell number and have them call me

## 2022-10-06 IMAGING — XA DG HIP (WITH PELVIS) OPERATIVE*R*
1 series · 2 of 2 positions shown · non-contrast
Comparison: 09/05/2020

FLUOROSCOPY TIME:  0 minutes 6 seconds

Dose: 1.3587 mGy

CLINICAL DATA: Dislocated RIGHT hip prosthesis, close reduction

EXAM:
OPERATIVE RIGHT HIP (WITH PELVIS IF PERFORMED) to VIEWS
TECHNIQUE: Fluoroscopic spot image(s) were submitted for interpretation
post-operatively.

[Series 1: unknown protocol · 0.20mm/px · 2 of 2 slices shown]
[im 1/2]
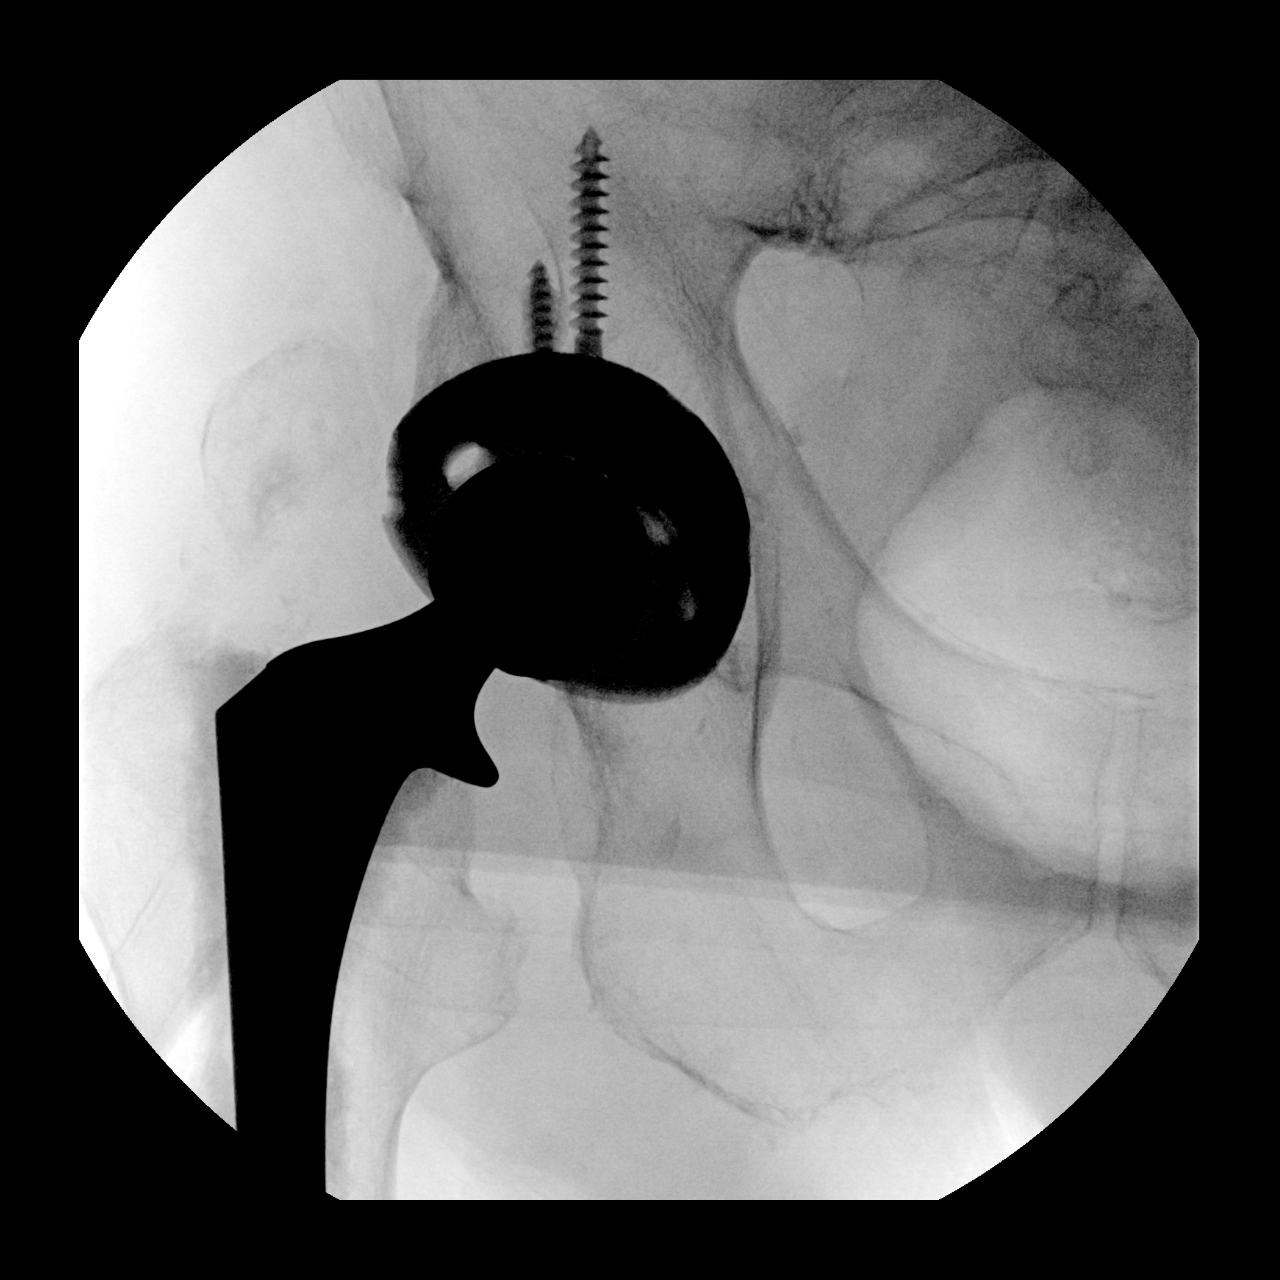
[im 2/2]
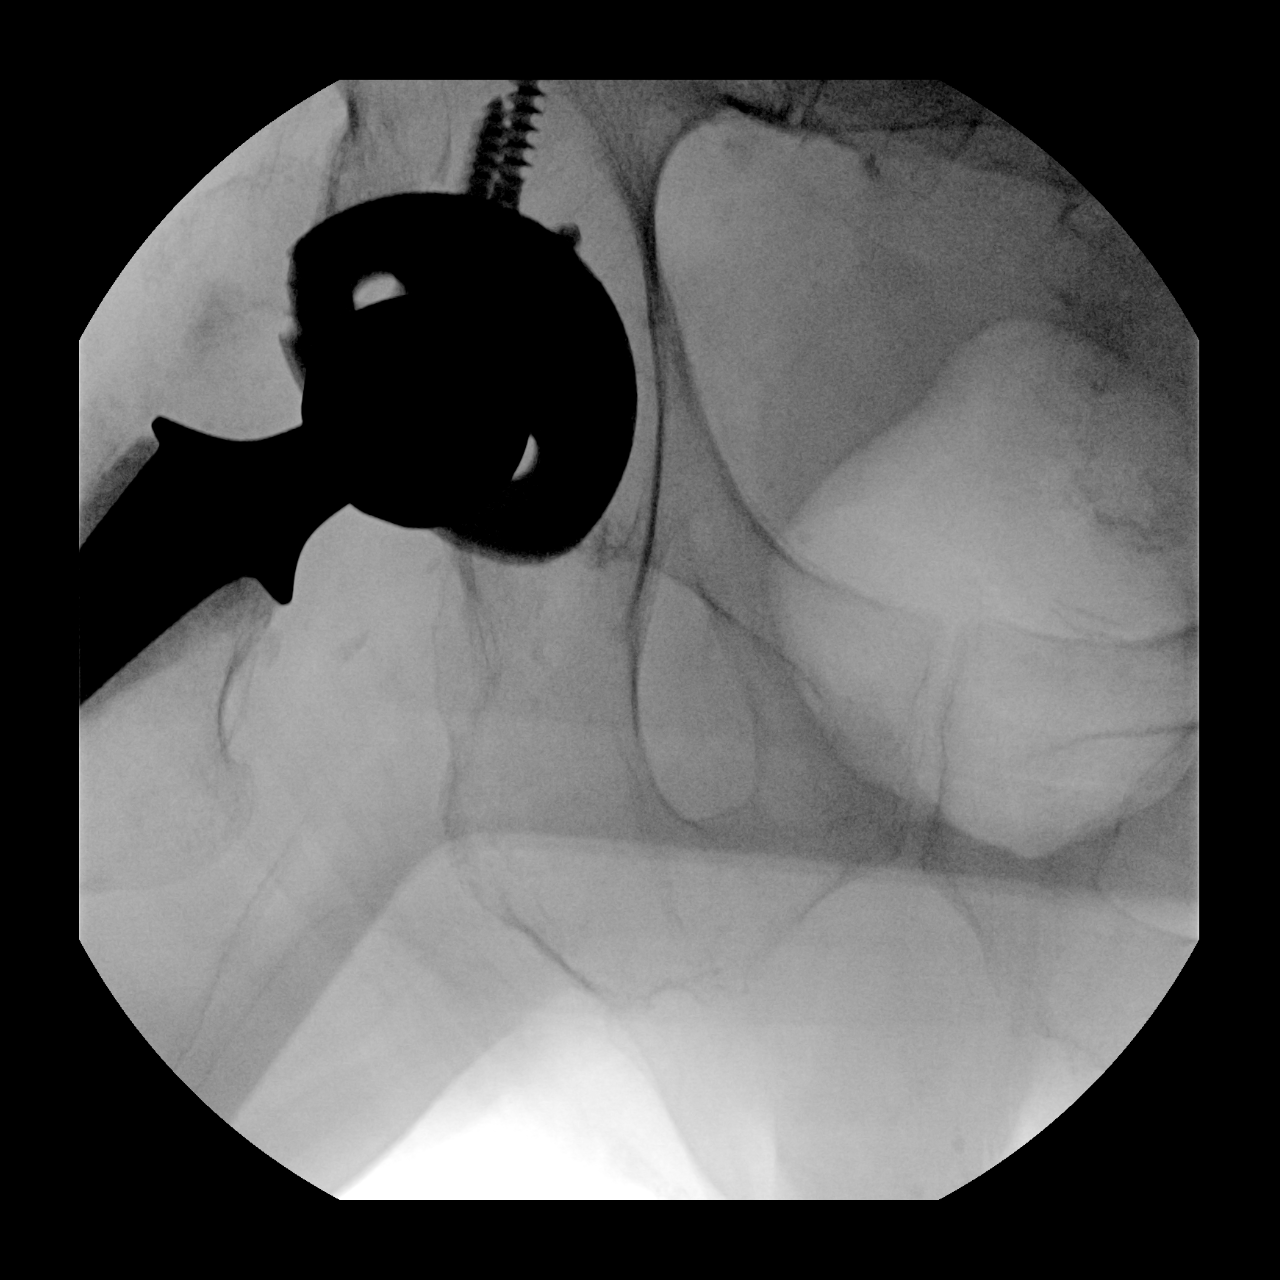

[2 of 2 positions shown; findings below may reference images not displayed]

FINDINGS: RIGHT hip prosthesis now appears normally located.

Entirety of femoral component not seen.

Bones demineralized.

No fractures identified.
IMPRESSION: RIGHT hip prosthesis now appears normally located.

Osseous demineralization.

## 2022-12-21 ENCOUNTER — Telehealth: Payer: Self-pay

## 2022-12-21 NOTE — Telephone Encounter (Signed)
-----   Message from Creig Hines sent at 12/21/2022 11:38 AM EDT ----- Regarding: RE: REFERRAL- [facility] Cbc with diff and see me next Tuesday or friday ----- Message ----- From: Eduard Clos, CMA Sent: 12/21/2022  11:32 AM EDT To: Creig Hines, MD Subject: FW: REFERRAL- [facility]             Please advise ----- Message ----- From: Reggy Eye Sent: 12/21/2022  10:53 AM EDT To: Corene Cornea, RN; Creig Hines, MD; # Subject: RE: REFERRAL- [facility]             Sorry I did not put patient  for whom was being referred. ----- Message ----- From: Corene Cornea, RN Sent: 12/18/2022   1:47 PM EDT To: Reggy Eye Subject: RE: REFERRAL- [facility]             Who is the pt name ----- Message ----- From: Reggy Eye Sent: 12/17/2022   3:27 PM EDT To: Corene Cornea, RN; Creig Hines, MD; # Subject: REFERRAL- [facility]                 REFERRAL- [facility] sent to his chart. Established patient of Dr Smith Robert being referred by Dr Angie Fava for pancytopenia. He was last seen 07/21/2021 and his 6 mo appt was cancelled b/c he was in rehab. He never called back to reschedule. Dr Smith Robert please advise.

## 2022-12-21 NOTE — Telephone Encounter (Signed)
Patient has been scheduled to be seen in the cancer center 01/01/23

## 2022-12-24 ENCOUNTER — Other Ambulatory Visit: Payer: Self-pay | Admitting: *Deleted

## 2022-12-24 DIAGNOSIS — D61818 Other pancytopenia: Secondary | ICD-10-CM

## 2023-01-01 ENCOUNTER — Inpatient Hospital Stay: Payer: Medicare HMO

## 2023-01-01 ENCOUNTER — Inpatient Hospital Stay: Payer: Medicare HMO | Admitting: Oncology

## 2023-01-01 ENCOUNTER — Telehealth: Payer: Self-pay | Admitting: Oncology

## 2023-01-01 NOTE — Telephone Encounter (Signed)
Called patient to reschedule his appt. He said he would have to call me back to reschedule at a later time

## 2023-01-08 ENCOUNTER — Other Ambulatory Visit: Payer: Self-pay

## 2023-01-08 DIAGNOSIS — D539 Nutritional anemia, unspecified: Secondary | ICD-10-CM

## 2023-01-08 DIAGNOSIS — D61818 Other pancytopenia: Secondary | ICD-10-CM

## 2023-01-11 ENCOUNTER — Inpatient Hospital Stay: Payer: Medicare HMO | Attending: Oncology

## 2023-01-11 ENCOUNTER — Inpatient Hospital Stay: Payer: Medicare HMO | Admitting: Oncology

## 2023-01-11 ENCOUNTER — Encounter: Payer: Self-pay | Admitting: Oncology

## 2023-01-11 VITALS — BP 114/73 | HR 73 | Temp 97.3°F | Resp 18 | Ht 72.0 in | Wt 155.8 lb

## 2023-01-11 DIAGNOSIS — N189 Chronic kidney disease, unspecified: Secondary | ICD-10-CM | POA: Insufficient documentation

## 2023-01-11 DIAGNOSIS — Z888 Allergy status to other drugs, medicaments and biological substances status: Secondary | ICD-10-CM | POA: Insufficient documentation

## 2023-01-11 DIAGNOSIS — D539 Nutritional anemia, unspecified: Secondary | ICD-10-CM

## 2023-01-11 DIAGNOSIS — R5383 Other fatigue: Secondary | ICD-10-CM | POA: Diagnosis not present

## 2023-01-11 DIAGNOSIS — Z79899 Other long term (current) drug therapy: Secondary | ICD-10-CM | POA: Insufficient documentation

## 2023-01-11 DIAGNOSIS — Z87891 Personal history of nicotine dependence: Secondary | ICD-10-CM | POA: Diagnosis not present

## 2023-01-11 DIAGNOSIS — D649 Anemia, unspecified: Secondary | ICD-10-CM | POA: Diagnosis present

## 2023-01-11 DIAGNOSIS — D61818 Other pancytopenia: Secondary | ICD-10-CM | POA: Insufficient documentation

## 2023-01-11 DIAGNOSIS — D7589 Other specified diseases of blood and blood-forming organs: Secondary | ICD-10-CM | POA: Diagnosis not present

## 2023-01-11 LAB — CBC WITH DIFFERENTIAL (CANCER CENTER ONLY)
Abs Immature Granulocytes: 0.03 10*3/uL (ref 0.00–0.07)
Basophils Absolute: 0 10*3/uL (ref 0.0–0.1)
Basophils Relative: 0 %
Eosinophils Absolute: 0.1 10*3/uL (ref 0.0–0.5)
Eosinophils Relative: 1 %
HCT: 26.4 % — ABNORMAL LOW (ref 39.0–52.0)
Hemoglobin: 8.7 g/dL — ABNORMAL LOW (ref 13.0–17.0)
Immature Granulocytes: 1 %
Lymphocytes Relative: 6 %
Lymphs Abs: 0.3 10*3/uL — ABNORMAL LOW (ref 0.7–4.0)
MCH: 36.6 pg — ABNORMAL HIGH (ref 26.0–34.0)
MCHC: 33 g/dL (ref 30.0–36.0)
MCV: 110.9 fL — ABNORMAL HIGH (ref 80.0–100.0)
Monocytes Absolute: 0.4 10*3/uL (ref 0.1–1.0)
Monocytes Relative: 9 %
Neutro Abs: 3.7 10*3/uL (ref 1.7–7.7)
Neutrophils Relative %: 83 %
Platelet Count: 173 10*3/uL (ref 150–400)
RBC: 2.38 MIL/uL — ABNORMAL LOW (ref 4.22–5.81)
RDW: 12.9 % (ref 11.5–15.5)
WBC Count: 4.5 10*3/uL (ref 4.0–10.5)
nRBC: 0 % (ref 0.0–0.2)

## 2023-01-11 LAB — COMPREHENSIVE METABOLIC PANEL
ALT: 19 U/L (ref 0–44)
AST: 31 U/L (ref 15–41)
Albumin: 3.6 g/dL (ref 3.5–5.0)
Alkaline Phosphatase: 50 U/L (ref 38–126)
Anion gap: 8 (ref 5–15)
BUN: 31 mg/dL — ABNORMAL HIGH (ref 8–23)
CO2: 30 mmol/L (ref 22–32)
Calcium: 8.7 mg/dL — ABNORMAL LOW (ref 8.9–10.3)
Chloride: 91 mmol/L — ABNORMAL LOW (ref 98–111)
Creatinine, Ser: 2.1 mg/dL — ABNORMAL HIGH (ref 0.61–1.24)
GFR, Estimated: 30 mL/min — ABNORMAL LOW (ref >60)
Glucose, Bld: 87 mg/dL (ref 70–99)
Potassium: 3.8 mmol/L (ref 3.5–5.1)
Sodium: 129 mmol/L — ABNORMAL LOW (ref 135–145)
Total Bilirubin: 0.5 mg/dL (ref 0.3–1.2)
Total Protein: 6.7 g/dL (ref 6.5–8.1)

## 2023-01-11 LAB — CBC WITH DIFFERENTIAL/PLATELET
Abs Immature Granulocytes: 0.02 10*3/uL (ref 0.00–0.07)
Basophils Absolute: 0 10*3/uL (ref 0.0–0.1)
Basophils Relative: 1 %
Eosinophils Absolute: 0.1 10*3/uL (ref 0.0–0.5)
Eosinophils Relative: 2 %
HCT: 25.8 % — ABNORMAL LOW (ref 39.0–52.0)
Hemoglobin: 8.7 g/dL — ABNORMAL LOW (ref 13.0–17.0)
Immature Granulocytes: 1 %
Lymphocytes Relative: 10 %
Lymphs Abs: 0.4 10*3/uL — ABNORMAL LOW (ref 0.7–4.0)
MCH: 37.8 pg — ABNORMAL HIGH (ref 26.0–34.0)
MCHC: 33.7 g/dL (ref 30.0–36.0)
MCV: 112.2 fL — ABNORMAL HIGH (ref 80.0–100.0)
Monocytes Absolute: 0.3 10*3/uL (ref 0.1–1.0)
Monocytes Relative: 8 %
Neutro Abs: 3.2 10*3/uL (ref 1.7–7.7)
Neutrophils Relative %: 78 %
Platelets: 160 10*3/uL (ref 150–400)
RBC: 2.3 MIL/uL — ABNORMAL LOW (ref 4.22–5.81)
RDW: 12.8 % (ref 11.5–15.5)
WBC: 4 10*3/uL (ref 4.0–10.5)
nRBC: 0 % (ref 0.0–0.2)

## 2023-01-11 LAB — FERRITIN: Ferritin: 575 ng/mL — ABNORMAL HIGH (ref 24–336)

## 2023-01-11 LAB — VITAMIN B12: Vitamin B-12: 1031 pg/mL — ABNORMAL HIGH (ref 180–914)

## 2023-01-11 LAB — RETICULOCYTES
Immature Retic Fract: 8 % (ref 2.3–15.9)
RBC.: 2.38 MIL/uL — ABNORMAL LOW (ref 4.22–5.81)
Retic Count, Absolute: 19.3 10*3/uL (ref 19.0–186.0)
Retic Ct Pct: 0.8 % (ref 0.4–3.1)

## 2023-01-11 LAB — TSH: TSH: 105 u[IU]/mL — ABNORMAL HIGH (ref 0.350–4.500)

## 2023-01-11 LAB — FOLATE: Folate: >40 ng/mL (ref >5.9)

## 2023-01-11 LAB — LACTATE DEHYDROGENASE: LDH: 178 U/L (ref 98–192)

## 2023-01-11 NOTE — Progress Notes (Signed)
Hematology/Oncology Consult note [facility]  Telephone:([phone] [phone]  Patient Care Team: Feliz Beam, MD as PCP - General (Internal Medicine) Creig Hines, MD as Consulting Physician (Oncology)   Name of the patient: Edwin Christensen  [id number]  [birth year]   Date of visit: 01/11/23  Diagnosis-anemia of chronic kidney disease  Chief complaint/ Reason for visit-routine follow-up of anemia  Heme/Onc history: Patient is a 88 year old African-American male who has been referred to me in the past for macrocytic anemia.  Anemia has been mostly attributed to chronic kidney disease.  It had remained stable around 10 up until March 2023 and patient was subsequently lost to follow-up.  He has not required any EPO so far.  He does have a history of MGUS with an IgA lambda M protein of 0.4 g detected on his prior blood work.  Free light chain ratio in the past has been normal.  Interval history-patient reports some ongoing fatigue.  Denies any other complaints at this time  ECOG PS- 2 Pain scale- 0   Review of systems- Review of Systems  Constitutional:  Positive for malaise/fatigue. Negative for chills, fever and weight loss.  HENT:  Negative for congestion, ear discharge and nosebleeds.   Eyes:  Negative for blurred vision.  Respiratory:  Negative for cough, hemoptysis, sputum production, shortness of breath and wheezing.   Cardiovascular:  Negative for chest pain, palpitations, orthopnea and claudication.  Gastrointestinal:  Negative for abdominal pain, blood in stool, constipation, diarrhea, heartburn, melena, nausea and vomiting.  Genitourinary:  Negative for dysuria, flank pain, frequency, hematuria and urgency.  Musculoskeletal:  Negative for back pain, joint pain and myalgias.  Skin:  Negative for rash.  Neurological:  Negative for dizziness, tingling, focal weakness, seizures, weakness and headaches.  Endo/Heme/Allergies:  Does not  bruise/bleed easily.  Psychiatric/Behavioral:  Negative for depression and suicidal ideas. The patient does not have insomnia.       Allergies  Allergen Reactions   Methotrexate Derivatives     "Reaction in lungs"   Amlodipine Other (See Comments)    Peripheral edema   Atenolol     Developed orthostasis with concomitant administration with terazosin 5mg       Past Medical History:  Diagnosis Date   Anemia    Arthritis    rheumatoid, osteoarthritis   CHF (congestive heart failure) (HCC)    Chronic kidney disease    renal insufficiency   Coronary artery disease    GERD (gastroesophageal reflux disease)    Hypertension    Hyperuricemia    Hypothyroidism    Interstitial lung disease (HCC)    Sleep apnea    mld - CPAP   Wears dentures    full upper, partial lower     Past Surgical History:  Procedure Laterality Date   CATARACT EXTRACTION W/PHACO Right 09/08/2017   Procedure: CATARACT EXTRACTION PHACO AND INTRAOCULAR LENS PLACEMENT (IOC) RIGHT;  Surgeon: Lockie Mola, MD;  Location: [facility];  Service: Ophthalmology;  Laterality: Right;  sleep apnea   CATARACT EXTRACTION W/PHACO Left 10/06/2017   Procedure: CATARACT EXTRACTION PHACO AND INTRAOCULAR LENS PLACEMENT (IOC) LEFT;  Surgeon: Lockie Mola, MD;  Location: [facility];  Service: Ophthalmology;  Laterality: Left;   CERVICAL FUSION     COLONOSCOPY     CORONARY ARTERY BYPASS GRAFT  1992   ESOPHAGOGASTRODUODENOSCOPY     GASTROC RECESSION EXTREMITY Left 07/05/2015   Procedure: GASTROC RECESSION EXTREMITY;  Surgeon: Recardo Evangelist, DPM;  Location: [facility]  ORS;  Service: Podiatry;  Laterality: Left;  LMA w/ local   HIP CLOSED REDUCTION Right 09/05/2020   Procedure: CLOSED REDUCTION HIP;  Surgeon: Christena Flake, MD;  Location: [facility] ORS;  Service: Orthopedics;  Laterality: Right;   JOINT REPLACEMENT Right    hip   OSTECTOMY Left 07/05/2015   Procedure: OSTECTOMY 4th & 5th metatarsals left foot;   Surgeon: Recardo Evangelist, DPM;  Location: [facility] ORS;  Service: Podiatry;  Laterality: Left;  LMA w/ local    Social History   Socioeconomic History   Marital status: Married    Spouse name: Not on file   Number of children: Not on file   Years of education: Not on file   Highest education level: Not on file  Occupational History   Not on file  Tobacco Use   Smoking status: Former    Current packs/day: 0.00    Types: Cigarettes    Quit date: 06/23/1973    Years since quitting: 49.5   Smokeless tobacco: Never  Substance and Sexual Activity   Alcohol use: No   Drug use: No   Sexual activity: Not on file  Other Topics Concern   Not on file  Social History Narrative   Not on file   Social Determinants of Health   Financial Resource Strain: Low Risk  (12/11/2021)   Received from [facility], [facility]   Overall Financial Resource Strain (CARDIA)    Difficulty of Paying Living Expenses: Not very hard  Food Insecurity: No Food Insecurity (12/11/2021)   Received from [facility], [facility]   Hunger Vital Sign    Worried About Running Out of Food in the Last Year: Never true    Ran Out of Food in the Last Year: Never true  Transportation Needs: No Transportation Needs (12/11/2021)   Received from [facility], [facility]   [facility] - Transportation    Lack of Transportation (Medical): No    Lack of Transportation (Non-Medical): No  Physical Activity: Not on file  Stress: Not on file  Social Connections: Not on file  Intimate Partner Violence: Not At Risk (11/11/2020)   Received from [facility], [facility]   Humiliation, Afraid, Rape, and Kick questionnaire    Fear of Current or Ex-Partner: No    Emotionally Abused: No    Physically Abused: No    Sexually Abused: No    History reviewed. No pertinent family history.   Current Outpatient Medications:    acetaminophen (TYLENOL) 325 MG tablet, Take by mouth., Disp: , Rfl:    aspirin 81 MG  EC tablet, Take by mouth., Disp: , Rfl:    atorvastatin (LIPITOR) 10 MG tablet, Take 1 tablet by mouth daily., Disp: , Rfl:    azaTHIOprine (IMURAN) 50 MG tablet, Take 50 mg by mouth daily., Disp: , Rfl:    chlorthalidone (HYGROTON) 25 MG tablet, Take 25 mg by mouth daily., Disp: , Rfl:    cyanocobalamin (,VITAMIN B-12,) 1000 MCG/ML injection, Inject 1,000 mcg into the muscle every 30 (thirty) days., Disp: , Rfl:    finasteride (PROSCAR) 5 MG tablet, Take 5 mg by mouth daily., Disp: , Rfl:    furosemide (LASIX) 20 MG tablet, Take 20 mg by mouth 3 (three) times a week., Disp: , Rfl:    hydroxychloroquine (PLAQUENIL) 200 MG tablet, Take 200 mg by mouth daily., Disp: , Rfl:    levothyroxine (SYNTHROID, LEVOTHROID) 25 MCG tablet, Take 25 mcg by  mouth daily before breakfast., Disp: , Rfl:    Multiple Vitamin (MULTIVITAMIN) tablet, Take 1 tablet by mouth daily., Disp: , Rfl:    omeprazole (PRILOSEC) 40 MG capsule, Take 40 mg by mouth daily., Disp: , Rfl:    potassium chloride SA (K-DUR,KLOR-CON) 20 MEQ tablet, Take 20 mEq by mouth daily., Disp: , Rfl:    predniSONE (DELTASONE) 10 MG tablet, Take 10 mg by mouth daily with breakfast., Disp: , Rfl:    simvastatin (ZOCOR) 40 MG tablet, Take 40 mg by mouth daily., Disp: , Rfl:    terazosin (HYTRIN) 2 MG capsule, Take 2 mg by mouth at bedtime., Disp: , Rfl:    aspirin 325 MG tablet, Take 325 mg by mouth daily. (Patient not taking: Reported on 07/21/2021), Disp: , Rfl:    meclizine (ANTIVERT) 25 MG tablet, Take 1 tablet (25 mg total) 3 (three) times daily as needed by mouth for dizziness. (Patient not taking: Reported on 01/21/2021), Disp: 30 tablet, Rfl: 0  Physical exam:  Vitals:   01/11/23 0907  BP: 114/73  Pulse: 73  Resp: 18  Temp: (!) 97.3 F (36.3 C)  TempSrc: Tympanic  SpO2: 99%  Weight: 155 lb 12.8 oz (70.7 kg)  Height: 6' (1.829 m)   Physical Exam Cardiovascular:     Rate and Rhythm: Normal rate and regular rhythm.     Heart sounds: Normal  heart sounds.  Pulmonary:     Effort: Pulmonary effort is normal.     Breath sounds: Normal breath sounds.  Abdominal:     General: Bowel sounds are normal.     Palpations: Abdomen is soft.  Skin:    General: Skin is warm and dry.  Neurological:     Mental Status: He is alert and oriented to person, place, and time.         Latest Ref Rng & Units 01/11/2023    9:34 AM  CMP  Glucose 70 - 99 mg/dL 87   BUN 8 - 23 mg/dL 31   Creatinine 2.53 - 1.24 mg/dL 6.64   Sodium 403 - 474 mmol/L 129   Potassium 3.5 - 5.1 mmol/L 3.8   Chloride 98 - 111 mmol/L 91   CO2 22 - 32 mmol/L 30   Calcium 8.9 - 10.3 mg/dL 8.7   Total Protein 6.5 - 8.1 g/dL 6.7   Total Bilirubin 0.3 - 1.2 mg/dL 0.5   Alkaline Phos 38 - 126 U/L 50   AST 15 - 41 U/L 31   ALT 0 - 44 U/L 19       Latest Ref Rng & Units 01/11/2023    9:34 AM  CBC  WBC 4.0 - 10.5 K/uL 4.5   Hemoglobin 13.0 - 17.0 g/dL 8.7   Hematocrit 25.9 - 52.0 % 26.4   Platelets 150 - 400 K/uL 173      Assessment and plan- Patient is a 88 y.o. male who is here for macrocytic anemia lost to follow-up  Patient last saw me in March 2023 when his hemoglobin was stable around 10 and was subsequently lost to follow up.  Presently his hemoglobin is drifted down to 8.7.  He does have chronic kidney disease which may be contributing to his anemia as well but before we consider EPO I would do a complete anemia workup today including CBC with differential CMP myeloma panel serum free light chains TSH haptoglobin LDH B12 levels and reticulocyte count.  I will see the patient back in 2 weeks time to  discuss results of blood work and further management.  Given the degree of macrocytosis of peripheral blood work does not reveal any obvious etiology I will consider a bone marrow biopsy to rule out MDS at his age before starting EPO.Marland Kitchen  He has a history of IgA MGUS which has not required any treatment and labs are up until last year were stable   Visit Diagnosis 1.  Macrocytic anemia      Dr. Owens Shark, MD, MPH [facility] at [facility] [phone] 01/11/2023 2:18 PM

## 2023-01-12 LAB — HAPTOGLOBIN: Haptoglobin: 145 mg/dL (ref 38–329)

## 2023-01-13 LAB — KAPPA/LAMBDA LIGHT CHAINS
Kappa free light chain: 65 mg/L — ABNORMAL HIGH (ref 3.3–19.4)
Kappa, lambda light chain ratio: 0.41 (ref 0.26–1.65)
Lambda free light chains: 157.7 mg/L — ABNORMAL HIGH (ref 5.7–26.3)

## 2023-01-15 LAB — MULTIPLE MYELOMA PANEL, SERUM
Albumin SerPl Elph-Mcnc: 3.4 g/dL (ref 2.9–4.4)
Albumin/Glob SerPl: 1.3 (ref 0.7–1.7)
Alpha 1: 0.3 g/dL (ref 0.0–0.4)
Alpha2 Glob SerPl Elph-Mcnc: 0.7 g/dL (ref 0.4–1.0)
B-Globulin SerPl Elph-Mcnc: 1.1 g/dL (ref 0.7–1.3)
Gamma Glob SerPl Elph-Mcnc: 0.8 g/dL (ref 0.4–1.8)
Globulin, Total: 2.8 g/dL (ref 2.2–3.9)
IgA: 646 mg/dL — ABNORMAL HIGH (ref 61–437)
IgG (Immunoglobin G), Serum: 903 mg/dL (ref 603–1613)
IgM (Immunoglobulin M), Srm: 42 mg/dL (ref 15–143)
M Protein SerPl Elph-Mcnc: 0.3 g/dL — ABNORMAL HIGH
Total Protein ELP: 6.2 g/dL (ref 6.0–8.5)

## 2023-01-25 ENCOUNTER — Ambulatory Visit: Payer: Medicare HMO | Admitting: Oncology

## 2023-01-27 ENCOUNTER — Telehealth: Payer: Self-pay | Admitting: *Deleted

## 2023-01-27 ENCOUNTER — Encounter: Payer: Self-pay | Admitting: Oncology

## 2023-01-27 ENCOUNTER — Inpatient Hospital Stay: Payer: Medicare HMO | Attending: Oncology | Admitting: Oncology

## 2023-01-27 VITALS — BP 162/89 | HR 69 | Temp 96.6°F | Resp 18 | Wt 162.3 lb

## 2023-01-27 DIAGNOSIS — D539 Nutritional anemia, unspecified: Secondary | ICD-10-CM

## 2023-01-27 DIAGNOSIS — Z79624 Long term (current) use of inhibitors of nucleotide synthesis: Secondary | ICD-10-CM | POA: Diagnosis not present

## 2023-01-27 DIAGNOSIS — D61818 Other pancytopenia: Secondary | ICD-10-CM | POA: Diagnosis present

## 2023-01-27 DIAGNOSIS — D7589 Other specified diseases of blood and blood-forming organs: Secondary | ICD-10-CM | POA: Insufficient documentation

## 2023-01-27 DIAGNOSIS — Z888 Allergy status to other drugs, medicaments and biological substances status: Secondary | ICD-10-CM | POA: Diagnosis not present

## 2023-01-27 DIAGNOSIS — N183 Chronic kidney disease, stage 3 unspecified: Secondary | ICD-10-CM | POA: Diagnosis not present

## 2023-01-27 DIAGNOSIS — Z7952 Long term (current) use of systemic steroids: Secondary | ICD-10-CM | POA: Diagnosis not present

## 2023-01-27 DIAGNOSIS — R5383 Other fatigue: Secondary | ICD-10-CM | POA: Diagnosis not present

## 2023-01-27 DIAGNOSIS — D649 Anemia, unspecified: Secondary | ICD-10-CM | POA: Insufficient documentation

## 2023-01-27 DIAGNOSIS — Z79899 Other long term (current) drug therapy: Secondary | ICD-10-CM | POA: Insufficient documentation

## 2023-01-27 DIAGNOSIS — Z79631 Long term (current) use of antimetabolite agent: Secondary | ICD-10-CM | POA: Insufficient documentation

## 2023-01-27 DIAGNOSIS — E039 Hypothyroidism, unspecified: Secondary | ICD-10-CM | POA: Diagnosis not present

## 2023-01-27 DIAGNOSIS — Z87891 Personal history of nicotine dependence: Secondary | ICD-10-CM | POA: Diagnosis not present

## 2023-01-27 NOTE — Telephone Encounter (Signed)
Per Edwin Christensen the pt has macrocytosis  and he is on 25 mcg of thyroid med and his last tsh was 105. Pt needs to have more thyroid med. . I called Dr. Dietrich Pates office. I spoke to staff and she felt that Edwin Christensen and Dr. Dietrich Pates to speak and also per the person on the phone says that pt was given 75 mcg of thyroids med. I gave the person md cell phone to speak to each other

## 2023-01-28 NOTE — Progress Notes (Signed)
Hematology/Oncology Consult note [facility]  Telephone:([phone] [phone]  Patient Care Team: Feliz Beam, MD as PCP - General (Internal Medicine) Creig Hines, MD as Consulting Physician (Oncology)   Name of the patient: Edwin Christensen  [id number]  1934-11-27   Date of visit: 01/28/23  Diagnosis-macrocytic anemia possibly secondary to hypothyroidism  Chief complaint/ Reason for visit-discuss results of blood work  Heme/Onc history: Patient is a 88 year old African-American male who has been referred to me in the past for macrocytic anemia.  Anemia has been mostly attributed to chronic kidney disease.  It had remained stable around 10 up until March 2023 and patient was subsequently lost to follow-up.  He has not required any EPO so far.  He does have a history of MGUS with an IgA lambda M protein of 0.4 g detected on his prior blood work.  Free light chain ratio in the past has been normal.   Results of blood work from 01/11/2023 were as follows: CBC showed an H&H of 8.7/26.4 with an MCV of 110.  [place] count and platelets were normal.  B12 levels normal at 1031.  Reticulocyte count low at 0.8 indicative of a hypoproliferative anemia.  Folate levels normal.  Ferritin level 575.  Myeloma panel showed 0.3 g of IgA lambda paraprotein.  Both kappa and lambda free light chains were elevated with a normal free light chain ratio 0.4.  BMP shows elevated creatinine of 2.1.  TSH elevated at 105  Interval history-patient states that he takes 25 mcg of levothyroxine at night.  States that he has been compliant with his levothyroxine.  Reports ongoing fatigue  ECOG PS- 2 Pain scale- 0   Review of systems- Review of Systems  Constitutional:  Positive for malaise/fatigue. Negative for chills, fever and weight loss.  HENT:  Negative for congestion, ear discharge and nosebleeds.   Eyes:  Negative for blurred vision.  Respiratory:  Negative for cough,  hemoptysis, sputum production, shortness of breath and wheezing.   Cardiovascular:  Negative for chest pain, palpitations, orthopnea and claudication.  Gastrointestinal:  Negative for abdominal pain, blood in stool, constipation, diarrhea, heartburn, melena, nausea and vomiting.  Genitourinary:  Negative for dysuria, flank pain, frequency, hematuria and urgency.  Musculoskeletal:  Negative for back pain, joint pain and myalgias.  Skin:  Negative for rash.  Neurological:  Negative for dizziness, tingling, focal weakness, seizures, weakness and headaches.  Endo/Heme/Allergies:  Does not bruise/bleed easily.  Psychiatric/Behavioral:  Negative for depression and suicidal ideas. The patient does not have insomnia.       Allergies  Allergen Reactions   Methotrexate Derivatives     "Reaction in lungs"   Amlodipine Other (See Comments)    Peripheral edema   Atenolol     Developed orthostasis with concomitant administration with terazosin 5mg       Past Medical History:  Diagnosis Date   Anemia    Arthritis    rheumatoid, osteoarthritis   CHF (congestive heart failure) (HCC)    Chronic kidney disease    renal insufficiency   Coronary artery disease    GERD (gastroesophageal reflux disease)    Hypertension    Hyperuricemia    Hypothyroidism    Interstitial lung disease (HCC)    Sleep apnea    mld - CPAP   Wears dentures    full upper, partial lower     Past Surgical History:  Procedure Laterality Date   CATARACT EXTRACTION W/PHACO Right 09/08/2017   Procedure:  CATARACT EXTRACTION PHACO AND INTRAOCULAR LENS PLACEMENT (IOC) RIGHT;  Surgeon: Lockie Mola, MD;  Location: [facility];  Service: Ophthalmology;  Laterality: Right;  sleep apnea   CATARACT EXTRACTION W/PHACO Left 10/06/2017   Procedure: CATARACT EXTRACTION PHACO AND INTRAOCULAR LENS PLACEMENT (IOC) LEFT;  Surgeon: Lockie Mola, MD;  Location: [facility];  Service: Ophthalmology;   Laterality: Left;   CERVICAL FUSION     COLONOSCOPY     CORONARY ARTERY BYPASS GRAFT  1992   ESOPHAGOGASTRODUODENOSCOPY     GASTROC RECESSION EXTREMITY Left 07/05/2015   Procedure: GASTROC RECESSION EXTREMITY;  Surgeon: Recardo Evangelist, DPM;  Location: [facility] ORS;  Service: Podiatry;  Laterality: Left;  LMA w/ local   HIP CLOSED REDUCTION Right 09/05/2020   Procedure: CLOSED REDUCTION HIP;  Surgeon: Christena Flake, MD;  Location: [facility] ORS;  Service: Orthopedics;  Laterality: Right;   JOINT REPLACEMENT Right    hip   OSTECTOMY Left 07/05/2015   Procedure: OSTECTOMY 4th & 5th metatarsals left foot;  Surgeon: Recardo Evangelist, DPM;  Location: [facility] ORS;  Service: Podiatry;  Laterality: Left;  LMA w/ local    Social History   Socioeconomic History   Marital status: Married    Spouse name: Not on file   Number of children: Not on file   Years of education: Not on file   Highest education level: Not on file  Occupational History   Not on file  Tobacco Use   Smoking status: Former    Current packs/day: 0.00    Types: Cigarettes    Quit date: 06/23/1973    Years since quitting: 49.6   Smokeless tobacco: Never  Substance and Sexual Activity   Alcohol use: No   Drug use: No   Sexual activity: Not on file  Other Topics Concern   Not on file  Social History Narrative   Not on file   Social Determinants of Health   Financial Resource Strain: Low Risk  (12/11/2021)   Received from [facility], [facility]   Overall Financial Resource Strain (CARDIA)    Difficulty of Paying Living Expenses: Not very hard  Food Insecurity: No Food Insecurity (12/11/2021)   Received from [facility], [facility]   Hunger Vital Sign    Worried About Running Out of Food in the Last Year: Never true    Ran Out of Food in the Last Year: Never true  Transportation Needs: No Transportation Needs (12/11/2021)   Received from [facility], [facility]   [facility] - Transportation    Lack of  Transportation (Medical): No    Lack of Transportation (Non-Medical): No  Physical Activity: Not on file  Stress: Not on file  Social Connections: Not on file  Intimate Partner Violence: Not At Risk (11/11/2020)   Received from [facility], [facility]   Humiliation, Afraid, Rape, and Kick questionnaire    Fear of Current or Ex-Partner: No    Emotionally Abused: No    Physically Abused: No    Sexually Abused: No    No family history on file.   Current Outpatient Medications:    acetaminophen (TYLENOL) 325 MG tablet, Take by mouth., Disp: , Rfl:    aspirin 81 MG EC tablet, Take by mouth., Disp: , Rfl:    atorvastatin (LIPITOR) 10 MG tablet, Take 1 tablet by mouth daily., Disp: , Rfl:    azaTHIOprine (IMURAN) 50 MG tablet, Take 50 mg by mouth daily., Disp: , Rfl:  chlorthalidone (HYGROTON) 25 MG tablet, Take 25 mg by mouth daily., Disp: , Rfl:    cyanocobalamin (,VITAMIN B-12,) 1000 MCG/ML injection, Inject 1,000 mcg into the muscle every 30 (thirty) days., Disp: , Rfl:    finasteride (PROSCAR) 5 MG tablet, Take 5 mg by mouth daily., Disp: , Rfl:    furosemide (LASIX) 20 MG tablet, Take 20 mg by mouth 3 (three) times a week., Disp: , Rfl:    hydroxychloroquine (PLAQUENIL) 200 MG tablet, Take 200 mg by mouth daily., Disp: , Rfl:    levothyroxine (SYNTHROID, LEVOTHROID) 25 MCG tablet, Take 25 mcg by mouth daily before breakfast., Disp: , Rfl:    meclizine (ANTIVERT) 25 MG tablet, Take 1 tablet (25 mg total) 3 (three) times daily as needed by mouth for dizziness., Disp: 30 tablet, Rfl: 0   Multiple Vitamin (MULTIVITAMIN) tablet, Take 1 tablet by mouth daily., Disp: , Rfl:    omeprazole (PRILOSEC) 40 MG capsule, Take 40 mg by mouth daily., Disp: , Rfl:    potassium chloride SA (K-DUR,KLOR-CON) 20 MEQ tablet, Take 20 mEq by mouth daily., Disp: , Rfl:    predniSONE (DELTASONE) 10 MG tablet, Take 10 mg by mouth daily with breakfast., Disp: , Rfl:    simvastatin (ZOCOR) 40 MG tablet,  Take 40 mg by mouth daily., Disp: , Rfl:    terazosin (HYTRIN) 2 MG capsule, Take 2 mg by mouth at bedtime., Disp: , Rfl:    aspirin 325 MG tablet, Take 325 mg by mouth daily. (Patient not taking: Reported on 07/21/2021), Disp: , Rfl:   Physical exam:  Vitals:   01/27/23 1401  BP: (!) 162/89  Pulse: 69  Resp: 18  Temp: (!) 96.6 F (35.9 C)  TempSrc: Tympanic  SpO2: 100%  Weight: 162 lb 4.8 oz (73.6 kg)   Physical Exam Cardiovascular:     Rate and Rhythm: Normal rate and regular rhythm.     Heart sounds: Normal heart sounds.  Pulmonary:     Effort: Pulmonary effort is normal.     Breath sounds: Normal breath sounds.  Abdominal:     General: Bowel sounds are normal.     Palpations: Abdomen is soft.  Skin:    General: Skin is warm and dry.  Neurological:     Mental Status: He is alert and oriented to person, place, and time.         Latest Ref Rng & Units 01/11/2023    9:34 AM  CMP  Glucose 70 - 99 mg/dL 87   BUN 8 - 23 mg/dL 31   Creatinine 1.66 - 1.24 mg/dL 0.63   Sodium 016 - 010 mmol/L 129   Potassium 3.5 - 5.1 mmol/L 3.8   Chloride 98 - 111 mmol/L 91   CO2 22 - 32 mmol/L 30   Calcium 8.9 - 10.3 mg/dL 8.7   Total Protein 6.5 - 8.1 g/dL 6.7   Total Bilirubin 0.3 - 1.2 mg/dL 0.5   Alkaline Phos 38 - 126 U/L 50   AST 15 - 41 U/L 31   ALT 0 - 44 U/L 19       Latest Ref Rng & Units 01/11/2023    9:34 AM  CBC  WBC 4.0 - 10.5 K/uL 4.5   Hemoglobin 13.0 - 17.0 g/dL 8.7   Hematocrit 93.2 - 52.0 % 26.4   Platelets 150 - 400 K/uL 173      Assessment and plan- Patient is a 89 y.o. male referred for macrocytic anemia  Macrocytic  anemia: I suspect this is multifactorial and chronic kidney disease may be contributing.  He does have a small amount of IgA lambda MGUS as well but I do not think that this is contributing to his anemia.  He has significant degree of macrocytosis and his TSH is elevated at 105.  Uncontrolled hypothyroidism can also lead to anemia.  We have  reached out to his primary care Dr. Dietrich Pates to adjust his thyroid medication we will see if his anemia improves with that.  I will repeat CBC ferritin and iron studies and TSH in about 6 to 7 weeks and see him thereafter.  If anemia persists despite correcting his thyroid function I will consider getting a bone marrow biopsy to rule out MDS before starting EPO injections for him   Visit Diagnosis 1. Macrocytic anemia   2. Acquired hypothyroidism      Dr. Owens Shark, MD, MPH [facility] [phone] 01/28/2023 11:35 AM

## 2023-03-17 ENCOUNTER — Telehealth: Payer: Self-pay | Admitting: *Deleted

## 2023-03-17 ENCOUNTER — Encounter: Payer: Self-pay | Admitting: Oncology

## 2023-03-17 ENCOUNTER — Inpatient Hospital Stay (HOSPITAL_BASED_OUTPATIENT_CLINIC_OR_DEPARTMENT_OTHER): Payer: Medicare HMO | Admitting: Oncology

## 2023-03-17 ENCOUNTER — Inpatient Hospital Stay: Payer: Medicare HMO | Attending: Oncology

## 2023-03-17 VITALS — BP 148/82 | HR 67 | Temp 95.4°F | Resp 18 | Wt 155.9 lb

## 2023-03-17 DIAGNOSIS — R5383 Other fatigue: Secondary | ICD-10-CM | POA: Insufficient documentation

## 2023-03-17 DIAGNOSIS — Z79899 Other long term (current) drug therapy: Secondary | ICD-10-CM | POA: Insufficient documentation

## 2023-03-17 DIAGNOSIS — D539 Nutritional anemia, unspecified: Secondary | ICD-10-CM

## 2023-03-17 DIAGNOSIS — D649 Anemia, unspecified: Secondary | ICD-10-CM | POA: Insufficient documentation

## 2023-03-17 DIAGNOSIS — I129 Hypertensive chronic kidney disease with stage 1 through stage 4 chronic kidney disease, or unspecified chronic kidney disease: Secondary | ICD-10-CM | POA: Insufficient documentation

## 2023-03-17 DIAGNOSIS — Z87891 Personal history of nicotine dependence: Secondary | ICD-10-CM | POA: Insufficient documentation

## 2023-03-17 DIAGNOSIS — E039 Hypothyroidism, unspecified: Secondary | ICD-10-CM | POA: Diagnosis not present

## 2023-03-17 DIAGNOSIS — N183 Chronic kidney disease, stage 3 unspecified: Secondary | ICD-10-CM | POA: Insufficient documentation

## 2023-03-17 DIAGNOSIS — D61818 Other pancytopenia: Secondary | ICD-10-CM | POA: Diagnosis present

## 2023-03-17 DIAGNOSIS — M1990 Unspecified osteoarthritis, unspecified site: Secondary | ICD-10-CM | POA: Insufficient documentation

## 2023-03-17 DIAGNOSIS — Z888 Allergy status to other drugs, medicaments and biological substances status: Secondary | ICD-10-CM | POA: Insufficient documentation

## 2023-03-17 LAB — CBC
HCT: 23.7 % — ABNORMAL LOW (ref 39.0–52.0)
Hemoglobin: 8.1 g/dL — ABNORMAL LOW (ref 13.0–17.0)
MCH: 38.6 pg — ABNORMAL HIGH (ref 26.0–34.0)
MCHC: 34.2 g/dL (ref 30.0–36.0)
MCV: 112.9 fL — ABNORMAL HIGH (ref 80.0–100.0)
Platelets: 102 10*3/uL — ABNORMAL LOW (ref 150–400)
RBC: 2.1 MIL/uL — ABNORMAL LOW (ref 4.22–5.81)
RDW: 13.3 % (ref 11.5–15.5)
WBC: 4.3 10*3/uL (ref 4.0–10.5)
nRBC: 0 % (ref 0.0–0.2)

## 2023-03-17 LAB — FERRITIN: Ferritin: 332 ng/mL (ref 24–336)

## 2023-03-17 LAB — IRON AND TIBC
Iron: 73 ug/dL (ref 45–182)
Saturation Ratios: 40 % — ABNORMAL HIGH (ref 17.9–39.5)
TIBC: 181 ug/dL — ABNORMAL LOW (ref 250–450)
UIBC: 108 ug/dL

## 2023-03-17 LAB — TSH: TSH: 21.209 u[IU]/mL — ABNORMAL HIGH (ref 0.350–4.500)

## 2023-03-17 LAB — T4, FREE: Free T4: 1.05 ng/dL (ref 0.61–1.12)

## 2023-03-17 NOTE — Telephone Encounter (Signed)
Request for Bone marrow biopsy faxed to Paulla Fore in [place].

## 2023-03-20 NOTE — Progress Notes (Signed)
Hematology/Oncology Consult note [facility]  Telephone:([phone] [phone]  Patient Care Team: Feliz Beam, MD as PCP - General (Internal Medicine) Creig Hines, MD as Consulting Physician (Oncology)   Name of the patient: Edwin Christensen  [id number]  [birth year]   Date of visit: 03/20/23  Diagnosis- macrocytic anemia possibly secondary to hypothyroidism   Chief complaint/ Reason for visit- routine f/u of anemia  Heme/Onc history: Patient is a 88 year old African-American male who has been referred to me in the past for macrocytic anemia.  Anemia has been mostly attributed to chronic kidney disease.  It had remained stable around 10 up until March 2023 and patient was subsequently lost to follow-up.  He has not required any EPO so far.  He does have a history of MGUS with an IgA lambda M protein of 0.4 g detected on his prior blood work.  Free light chain ratio in the past has been normal.   Results of blood work from 01/11/2023 were as follows: CBC showed an H&H of 8.7/26.4 with an MCV of 110.  Paszkiewicz count and platelets were normal.  B12 levels normal at 1031.  Reticulocyte count low at 0.8 indicative of a hypoproliferative anemia.  Folate levels normal.  Ferritin level 575.  Myeloma panel showed 0.3 g of IgA lambda paraprotein.  Both kappa and lambda free light chains were elevated with a normal free light chain ratio 0.4.  BMP shows elevated creatinine of 2.1.  TSH elevated at 105  Interval history-patient states that his thyroid medication dosing was adjusted by his PCP and he has been compliant with his medication.  ECOG PS- 2-3 Pain scale- 0   Review of systems- Review of Systems  Constitutional:  Positive for malaise/fatigue. Negative for chills, fever and weight loss.  HENT:  Negative for congestion, ear discharge and nosebleeds.   Eyes:  Negative for blurred vision.  Respiratory:  Negative for cough, hemoptysis, sputum production,  shortness of breath and wheezing.   Cardiovascular:  Negative for chest pain, palpitations, orthopnea and claudication.  Gastrointestinal:  Negative for abdominal pain, blood in stool, constipation, diarrhea, heartburn, melena, nausea and vomiting.  Genitourinary:  Negative for dysuria, flank pain, frequency, hematuria and urgency.  Musculoskeletal:  Negative for back pain, joint pain and myalgias.  Skin:  Negative for rash.  Neurological:  Negative for dizziness, tingling, focal weakness, seizures, weakness and headaches.  Endo/Heme/Allergies:  Does not bruise/bleed easily.  Psychiatric/Behavioral:  Negative for depression and suicidal ideas. The patient does not have insomnia.       Allergies  Allergen Reactions   Methotrexate Derivatives     "Reaction in lungs"   Amlodipine Other (See Comments)    Peripheral edema   Atenolol     Developed orthostasis with concomitant administration with terazosin 5mg       Past Medical History:  Diagnosis Date   Anemia    Arthritis    rheumatoid, osteoarthritis   CHF (congestive heart failure) (HCC)    Chronic kidney disease    renal insufficiency   Coronary artery disease    GERD (gastroesophageal reflux disease)    Hypertension    Hyperuricemia    Hypothyroidism    Interstitial lung disease (HCC)    Sleep apnea    mld - CPAP   Wears dentures    full upper, partial lower     Past Surgical History:  Procedure Laterality Date   CATARACT EXTRACTION W/PHACO Right 09/08/2017   Procedure: CATARACT EXTRACTION PHACO  AND INTRAOCULAR LENS PLACEMENT (IOC) RIGHT;  Surgeon: Lockie Mola, MD;  Location: [facility];  Service: Ophthalmology;  Laterality: Right;  sleep apnea   CATARACT EXTRACTION W/PHACO Left 10/06/2017   Procedure: CATARACT EXTRACTION PHACO AND INTRAOCULAR LENS PLACEMENT (IOC) LEFT;  Surgeon: Lockie Mola, MD;  Location: [facility];  Service: Ophthalmology;  Laterality: Left;   CERVICAL FUSION      COLONOSCOPY     CORONARY ARTERY BYPASS GRAFT  1992   ESOPHAGOGASTRODUODENOSCOPY     GASTROC RECESSION EXTREMITY Left 07/05/2015   Procedure: GASTROC RECESSION EXTREMITY;  Surgeon: Recardo Evangelist, DPM;  Location: [facility];  Service: Podiatry;  Laterality: Left;  LMA w/ local   HIP CLOSED REDUCTION Right 09/05/2020   Procedure: CLOSED REDUCTION HIP;  Surgeon: Christena Flake, MD;  Location: [facility];  Service: Orthopedics;  Laterality: Right;   JOINT REPLACEMENT Right    hip   OSTECTOMY Left 07/05/2015   Procedure: OSTECTOMY 4th & 5th metatarsals left foot;  Surgeon: Recardo Evangelist, DPM;  Location: [facility];  Service: Podiatry;  Laterality: Left;  LMA w/ local    Social History   Socioeconomic History   Marital status: Married    Spouse name: Not on file   Number of children: Not on file   Years of education: Not on file   Highest education level: Not on file  Occupational History   Not on file  Tobacco Use   Smoking status: Former    Current packs/day: 0.00    Types: Cigarettes    Quit date: 06/23/1973    Years since quitting: 49.7   Smokeless tobacco: Never  Substance and Sexual Activity   Alcohol use: No   Drug use: No   Sexual activity: Not on file  Other Topics Concern   Not on file  Social History Narrative   Not on file   Social Determinants of Health   Financial Resource Strain: Low Risk  (02/16/2023)   [facility]   Overall Financial Resource Strain (CARDIA)    Difficulty of Paying Living Expenses: Not very hard  Food Insecurity: No Food Insecurity (02/16/2023)   Received from [facility]   Hunger Vital Sign    Worried About Running Out of Food in the Last Year: Never true    Ran Out of Food in the Last Year: Never true  Transportation Needs: No Transportation Needs (02/16/2023)   Received from [facility] - Transportation    Lack of Transportation (Medical): No    Lack of Transportation (Non-Medical): No  Physical Activity:  Not on file  Stress: Not on file  Social Connections: Not on file  Intimate Partner Violence: Not At Risk (11/11/2020)   Received from [facility]   Humiliation, Afraid, Rape, and Kick questionnaire    Fear of Current or Ex-Partner: No    Emotionally Abused: No    Physically Abused: No    Sexually Abused: No    History reviewed. No pertinent family history.   Current Outpatient Medications:    acetaminophen (TYLENOL) 325 MG tablet, Take by mouth., Disp: , Rfl:    aspirin 325 MG tablet, Take 325 mg by mouth daily. (Patient not taking: Reported on 07/21/2021), Disp: , Rfl:    aspirin 81 MG EC tablet, Take by mouth., Disp: , Rfl:    atorvastatin (LIPITOR) 10 MG tablet, Take 1 tablet by mouth daily., Disp: , Rfl:    azaTHIOprine (IMURAN) 50  MG tablet, Take 50 mg by mouth daily., Disp: , Rfl:    chlorthalidone (HYGROTON) 25 MG tablet, Take 25 mg by mouth daily., Disp: , Rfl:    cyanocobalamin (,VITAMIN B-12,) 1000 MCG/ML injection, Inject 1,000 mcg into the muscle every 30 (thirty) days., Disp: , Rfl:    finasteride (PROSCAR) 5 MG tablet, Take 5 mg by mouth daily., Disp: , Rfl:    furosemide (LASIX) 20 MG tablet, Take 20 mg by mouth 3 (three) times a week., Disp: , Rfl:    hydroxychloroquine (PLAQUENIL) 200 MG tablet, Take 200 mg by mouth daily., Disp: , Rfl:    levothyroxine (SYNTHROID, LEVOTHROID) 25 MCG tablet, Take 25 mcg by mouth daily before breakfast., Disp: , Rfl:    meclizine (ANTIVERT) 25 MG tablet, Take 1 tablet (25 mg total) 3 (three) times daily as needed by mouth for dizziness., Disp: 30 tablet, Rfl: 0   Multiple Vitamin (MULTIVITAMIN) tablet, Take 1 tablet by mouth daily., Disp: , Rfl:    omeprazole (PRILOSEC) 40 MG capsule, Take 40 mg by mouth daily., Disp: , Rfl:    potassium chloride SA (K-DUR,KLOR-CON) 20 MEQ tablet, Take 20 mEq by mouth daily., Disp: , Rfl:    predniSONE (DELTASONE) 10 MG tablet, Take 10 mg by mouth daily with breakfast., Disp: , Rfl:     simvastatin (ZOCOR) 40 MG tablet, Take 40 mg by mouth daily., Disp: , Rfl:    terazosin (HYTRIN) 2 MG capsule, Take 2 mg by mouth at bedtime., Disp: , Rfl:   Physical exam:  Vitals:   03/17/23 1406  BP: (!) 148/82  Pulse: 67  Resp: 18  Temp: (!) 95.4 F (35.2 C)  TempSrc: Tympanic  SpO2: 100%  Weight: 155 lb 14.4 oz (70.7 kg)   Physical Exam Cardiovascular:     Rate and Rhythm: Normal rate and regular rhythm.     Heart sounds: Normal heart sounds.  Pulmonary:     Effort: Pulmonary effort is normal.     Breath sounds: Normal breath sounds.  Skin:    General: Skin is warm and dry.  Neurological:     Mental Status: He is alert and oriented to person, place, and time.         Latest Ref Rng & Units 01/11/2023    9:34 AM  CMP  Glucose 70 - 99 mg/dL 87   BUN 8 - 23 mg/dL 31   Creatinine 1.19 - 1.24 mg/dL 1.47   Sodium 829 - 562 mmol/L 129   Potassium 3.5 - 5.1 mmol/L 3.8   Chloride 98 - 111 mmol/L 91   CO2 22 - 32 mmol/L 30   Calcium 8.9 - 10.3 mg/dL 8.7   Total Protein 6.5 - 8.1 g/dL 6.7   Total Bilirubin 0.3 - 1.2 mg/dL 0.5   Alkaline Phos 38 - 126 U/L 50   AST 15 - 41 U/L 31   ALT 0 - 44 U/L 19       Latest Ref Rng & Units 03/17/2023    1:44 PM  CBC  WBC 4.0 - 10.5 K/uL 4.3   Hemoglobin 13.0 - 17.0 g/dL 8.1   Hematocrit 13.0 - 52.0 % 23.7   Platelets 150 - 400 K/uL 102     No images are attached to the encounter.  No results found.   Assessment and plan- Patient is a 88 y.o. male Referred for macrocytic anemia  H&H presently has gradually drifted down from 10-8.1 over the last 1 year.  Significant macrocytosis  with an MCV ranging between 10 7-112.  C[place] count is normal at 4.3 and platelets presently mildly low at 102.  Despite adjusting his thyroid medication macrocytic anemia persists.  Given his age there is a concern for possible MDS and I would therefore like to get a bone marrow biopsy to rule out the same.  Patient does have evidence of CKD as  well and his GFR is in the 30s.  We could consider doing EPO for anemia of chronic kidney disease if there is no evidence of MDS or other pathology found on his bone marrow.  No evidence of iron deficiency based on his labs   Visit Diagnosis 1. Macrocytic anemia      Dr. Owens Shark, MD, MPH [facility] [phone] 03/20/2023 8:39 AM

## 2023-03-22 ENCOUNTER — Telehealth: Payer: Self-pay | Admitting: *Deleted

## 2023-03-22 NOTE — Telephone Encounter (Signed)
I called the patient and he is agreeable for the bone marrow biopsy and I went over he has to be n.p.o. 8 hours prior to his biopsy, he has to have a driver to take him here because he will get conscious sedation.  He would have to go over to heart and vascular which is in the front of the hospital right beside of ER.  The appointment is November 14 arrival at 830 for a 930 biopsy.  Patient also wanted me to send in the mail.  I put all the instructions and the appointment and put it in the mail and send it out today

## 2023-03-30 ENCOUNTER — Other Ambulatory Visit (HOSPITAL_COMMUNITY): Payer: Self-pay | Admitting: Student

## 2023-03-30 DIAGNOSIS — D638 Anemia in other chronic diseases classified elsewhere: Secondary | ICD-10-CM

## 2023-03-31 NOTE — H&P (Signed)
Chief Complaint: Patient was seen in consultation today for bone marrow biopsy at the request of Rao,Archana C  Referring Physician(s): Rao,Archana C  Supervising Physician: Gilmer Mor  Patient Status: [facility] - Out-pt  History of Present Illness: Edwin Christensen is a 88 y.o. 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  The patient denies any current chest pain or change in chronic shortness of breath. The patient denies any history of sleep apnea or chronic oxygen use. He has no known complications to sedation.    Past Medical History:  Diagnosis Date   Anemia    Arthritis    rheumatoid, osteoarthritis   CHF (congestive heart failure) (HCC)    Chronic kidney disease    renal insufficiency   Coronary artery disease    GERD (gastroesophageal reflux disease)    Hypertension    Hyperuricemia    Hypothyroidism    Interstitial lung disease (HCC)    Sleep apnea    mld - CPAP   Wears dentures    full upper, partial lower    Past Surgical History:  Procedure Laterality Date   CATARACT EXTRACTION W/PHACO Right 09/08/2017   Procedure: CATARACT EXTRACTION PHACO AND INTRAOCULAR LENS PLACEMENT (IOC) RIGHT;  Surgeon: Lockie Mola, MD;  Location: [facility];  Service: Ophthalmology;  Laterality: Right;  sleep apnea   CATARACT EXTRACTION W/PHACO Left 10/06/2017   Procedure: CATARACT EXTRACTION PHACO AND INTRAOCULAR LENS PLACEMENT (IOC) LEFT;  Surgeon: Lockie Mola, MD;  Location: [facility];  Service: Ophthalmology;  Laterality: Left;   CERVICAL FUSION     COLONOSCOPY     CORONARY ARTERY BYPASS GRAFT  1992   ESOPHAGOGASTRODUODENOSCOPY     GASTROC RECESSION EXTREMITY Left 07/05/2015   Procedure: GASTROC RECESSION EXTREMITY;  Surgeon: Recardo Evangelist, DPM;  Location: [facility];  Service: Podiatry;   Laterality: Left;  LMA w/ local   HIP CLOSED REDUCTION Right 09/05/2020   Procedure: CLOSED REDUCTION HIP;  Surgeon: Christena Flake, MD;  Location: [facility];  Service: Orthopedics;  Laterality: Right;   JOINT REPLACEMENT Right    hip   OSTECTOMY Left 07/05/2015   Procedure: OSTECTOMY 4th & 5th metatarsals left foot;  Surgeon: Recardo Evangelist, DPM;  Location: [facility];  Service: Podiatry;  Laterality: Left;  LMA w/ local    Allergies: Methotrexate derivatives, Amlodipine, and Atenolol  Medications: Prior to Admission medications   Medication Sig Start Date End Date Taking? Authorizing Provider  acetaminophen (TYLENOL) 325 MG tablet Take by mouth.    [provider]  aspirin 325 MG tablet Take 325 mg by mouth daily. Patient not taking: Reported on 07/21/2021    [provider]  aspirin 81 MG EC tablet Take by mouth.    [provider]  atorvastatin (LIPITOR) 10 MG tablet Take 1 tablet by mouth daily. 05/31/18   [provider]  azaTHIOprine (IMURAN) 50 MG tablet Take 50 mg by mouth daily.    [provider]  chlorthalidone (HYGROTON) 25 MG tablet Take 25 mg by mouth daily.    [provider]  cyanocobalamin (,VITAMIN B-12,) 1000 MCG/ML injection Inject 1,000 mcg into the muscle every 30 (thirty) days.    [provider]  finasteride (PROSCAR) 5 MG tablet Take 5 mg by mouth daily.    [provider]  furosemide (LASIX) 20 MG tablet Take  20 mg by mouth 3 (three) times a week.    [provider]  hydroxychloroquine (PLAQUENIL) 200 MG tablet Take 200 mg by mouth daily.    [provider]  levothyroxine (SYNTHROID, LEVOTHROID) 25 MCG tablet Take 25 mcg by mouth daily before breakfast.    [provider]  meclizine (ANTIVERT) 25 MG tablet Take 1 tablet (25 mg total) 3 (three) times daily as needed by mouth for dizziness. 04/04/17   Tommie Sams, DO  Multiple Vitamin (MULTIVITAMIN) tablet Take 1 tablet by  mouth daily.    [provider]  omeprazole (PRILOSEC) 40 MG capsule Take 40 mg by mouth daily.    [provider]  potassium chloride SA (K-DUR,KLOR-CON) 20 MEQ tablet Take 20 mEq by mouth daily.    [provider]  predniSONE (DELTASONE) 10 MG tablet Take 10 mg by mouth daily with breakfast.    [provider]  simvastatin (ZOCOR) 40 MG tablet Take 40 mg by mouth daily.    [provider]  terazosin (HYTRIN) 2 MG capsule Take 2 mg by mouth at bedtime.    [provider]     History reviewed. No pertinent family history.  Social History   Socioeconomic History   Marital status: Married    Spouse name: Not on file   Number of children: Not on file   Years of education: Not on file   Highest education level: Not on file  Occupational History   Not on file  Tobacco Use   Smoking status: Former    Current packs/day: 0.00    Types: Cigarettes    Quit date: 06/23/1973    Years since quitting: 49.8   Smokeless tobacco: Never  Substance and Sexual Activity   Alcohol use: No   Drug use: No   Sexual activity: Not on file  Other Topics Concern   Not on file  Social History Narrative   Not on file   Social Determinants of Health   Financial Resource Strain: Low Risk  (02/16/2023)   Received from [facility]   Overall Financial Resource Strain (CARDIA)    Difficulty of Paying Living Expenses: Not very hard  Food Insecurity: No Food Insecurity (02/16/2023)   Received from [facility]   Hunger Vital Sign    Worried About Running Out of Food in the Last Year: Never true    Ran Out of Food in the Last Year: Never true  Transportation Needs: No Transportation Needs (02/16/2023)   Received from [facility] - Transportation    Lack of Transportation (Medical): No    Lack of Transportation (Non-Medical): No  Physical Activity: Not on file  Stress: Not on file  Social Connections: Not on file    Review of  Systems: A 12 point ROS discussed and pertinent positives are indicated in the HPI above.  All other systems are negative.  Review of Systems  Vital Signs: BP (!) 172/106   Pulse 68   Temp (!) 97.5 F (36.4 C) (Oral)   Resp 18   Ht 6' (1.829 m)   Wt 151 lb 14.4 oz (68.9 kg)   SpO2 97%   BMI 20.60 kg/m    Physical Exam Constitutional:      General: He is not in acute distress.    Comments: Frail appearing  HENT:     Head: Normocephalic and atraumatic.  Cardiovascular:     Rate and Rhythm: Normal rate and regular rhythm.  Pulmonary:     Effort: No respiratory distress.     Breath sounds: Rhonchi present.     Comments: Diminished breath sounds bilaterally Skin:    General: Skin is warm and dry.  Neurological:     Mental Status: He is alert and oriented to person, place, and time.     Imaging: No results found.  Labs:  CBC: Recent Labs    01/11/23 0853 01/11/23 0934 03/17/23 1344 04/01/23 0847  WBC 4.0 4.5 4.3 3.9*  HGB 8.7* 8.7* 8.1* 9.1*  HCT 25.8* 26.4* 23.7* 26.6*  PLT 160 173 102* 136*    COAGS: No results for input(s): "INR", "APTT" in the last 8760 hours.  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  The patient has been NPO, labs and vitals have been reviewed.  Risks and benefits of image guided bone marrow biopsy with minimal sedation given his frail appearance and diminished breath sounds was discussed with the patient and/or patient's family including, but not limited to  bleeding, infection, damage to adjacent structures or low yield requiring additional tests.  All of the questions were answered and there is agreement to proceed.  Consent signed and in chart.   Thank you for this interesting consult.  I greatly enjoyed meeting Edwin Christensen and look forward to participating in their care.  A copy of this report was sent to the requesting provider on this date.  Electronically Signed: Berneta Levins, PA-C 04/01/2023, 9:22 AM   I spent a total of 15 Minutes in face to face in clinical consultation, greater than 50% of which was counseling/coordinating care for bone marrow biopsy.

## 2023-03-31 NOTE — Progress Notes (Signed)
Patient for IR Bone Marrow Biopsy on Thurs 04/01/2023, I called and spoke with the patient on the phone and gave pre-procedure instructions. Pt was made aware to be here at 8:30a, NPO after MN prior to procedure as well as driver post procedure/recovery/discharge. Pt stated understanding.  Called 03/31/2023

## 2023-04-01 ENCOUNTER — Encounter: Payer: Self-pay | Admitting: Radiology

## 2023-04-01 ENCOUNTER — Ambulatory Visit
Admission: RE | Admit: 2023-04-01 | Discharge: 2023-04-01 | Disposition: A | Discharge status: Home / Self Care | Payer: Medicare HMO | Source: Ambulatory Visit | Attending: Oncology | Admitting: Oncology

## 2023-04-01 ENCOUNTER — Other Ambulatory Visit: Payer: Self-pay

## 2023-04-01 DIAGNOSIS — Z87891 Personal history of nicotine dependence: Secondary | ICD-10-CM | POA: Diagnosis not present

## 2023-04-01 DIAGNOSIS — D72822 Plasmacytosis: Secondary | ICD-10-CM | POA: Diagnosis not present

## 2023-04-01 DIAGNOSIS — D61818 Other pancytopenia: Secondary | ICD-10-CM | POA: Insufficient documentation

## 2023-04-01 DIAGNOSIS — D539 Nutritional anemia, unspecified: Secondary | ICD-10-CM | POA: Insufficient documentation

## 2023-04-01 DIAGNOSIS — I2510 Atherosclerotic heart disease of native coronary artery without angina pectoris: Secondary | ICD-10-CM | POA: Diagnosis not present

## 2023-04-01 DIAGNOSIS — E039 Hypothyroidism, unspecified: Secondary | ICD-10-CM | POA: Diagnosis not present

## 2023-04-01 DIAGNOSIS — N189 Chronic kidney disease, unspecified: Secondary | ICD-10-CM | POA: Insufficient documentation

## 2023-04-01 DIAGNOSIS — I130 Hypertensive heart and chronic kidney disease with heart failure and stage 1 through stage 4 chronic kidney disease, or unspecified chronic kidney disease: Secondary | ICD-10-CM | POA: Insufficient documentation

## 2023-04-01 DIAGNOSIS — Z1379 Encounter for other screening for genetic and chromosomal anomalies: Secondary | ICD-10-CM | POA: Diagnosis not present

## 2023-04-01 DIAGNOSIS — I509 Heart failure, unspecified: Secondary | ICD-10-CM | POA: Diagnosis not present

## 2023-04-01 DIAGNOSIS — D638 Anemia in other chronic diseases classified elsewhere: Secondary | ICD-10-CM

## 2023-04-01 HISTORY — PX: IR BONE MARROW BIOPSY & ASPIRATION: IMG5727

## 2023-04-01 LAB — CBC WITH DIFFERENTIAL/PLATELET
Abs Immature Granulocytes: 0.02 10*3/uL (ref 0.00–0.07)
Basophils Absolute: 0 10*3/uL (ref 0.0–0.1)
Basophils Relative: 1 %
Eosinophils Absolute: 0.1 10*3/uL (ref 0.0–0.5)
Eosinophils Relative: 2 %
HCT: 26.6 % — ABNORMAL LOW (ref 39.0–52.0)
Hemoglobin: 9.1 g/dL — ABNORMAL LOW (ref 13.0–17.0)
Immature Granulocytes: 1 %
Lymphocytes Relative: 11 %
Lymphs Abs: 0.4 10*3/uL — ABNORMAL LOW (ref 0.7–4.0)
MCH: 37.6 pg — ABNORMAL HIGH (ref 26.0–34.0)
MCHC: 34.2 g/dL (ref 30.0–36.0)
MCV: 109.9 fL — ABNORMAL HIGH (ref 80.0–100.0)
Monocytes Absolute: 0.5 10*3/uL (ref 0.1–1.0)
Monocytes Relative: 13 %
Neutro Abs: 2.9 10*3/uL (ref 1.7–7.7)
Neutrophils Relative %: 72 %
Platelets: 136 10*3/uL — ABNORMAL LOW (ref 150–400)
RBC: 2.42 MIL/uL — ABNORMAL LOW (ref 4.22–5.81)
RDW: 13.1 % (ref 11.5–15.5)
WBC: 3.9 10*3/uL — ABNORMAL LOW (ref 4.0–10.5)
nRBC: 0 % (ref 0.0–0.2)

## 2023-04-01 MED ORDER — FENTANYL CITRATE (PF) 100 MCG/2ML IJ SOLN
INTRAMUSCULAR | Status: AC | PRN
  Administered 2023-04-01: 25 ug via INTRAVENOUS

## 2023-04-01 MED ORDER — HEPARIN SOD (PORK) LOCK FLUSH 100 UNIT/ML IV SOLN
INTRAVENOUS | Status: AC
Start: 2023-04-01 — End: ?
  Filled 2023-04-01: qty 5

## 2023-04-01 MED ORDER — FENTANYL CITRATE (PF) 100 MCG/2ML IJ SOLN
INTRAMUSCULAR | Status: AC
  Filled 2023-04-01: qty 2

## 2023-04-01 MED ORDER — LIDOCAINE 1 % OPTIME INJ - NO CHARGE
10.0000 mL | Freq: Once | INTRAMUSCULAR | Status: AC
  Administered 2023-04-01: 10 mL via INTRADERMAL
  Filled 2023-04-01: qty 10

## 2023-04-01 MED ORDER — MIDAZOLAM HCL 2 MG/2ML IJ SOLN
INTRAMUSCULAR | Status: AC
  Filled 2023-04-01: qty 2

## 2023-04-01 NOTE — Procedures (Addendum)
Interventional Radiology Procedure Note  Procedure: image guided aspirate and core biopsy of right posterior iliac bone Complications: None Recommendations: - Bedrest supine x 1 hrs - OTC's PRN  Pain - Follow biopsy results  Signed,  Yvone Neu. Loreta Ave, DO

## 2023-04-01 NOTE — Progress Notes (Signed)
Patient clinically stable post IR BMB per Dr Loreta Ave, tolerated well. Denies complaints post procedure. Received Fentanyl 25 mcg IV for procedure. Report given to Kellie Moor post procedure/specials/21

## 2023-04-01 NOTE — Progress Notes (Signed)
10:35, New drainage noted to dressing, (dressing was saturated).  Removed dressing, held pressure for 5 minutes, and new dressing applied.

## 2023-04-01 NOTE — Progress Notes (Signed)
Dr. Loreta Ave at bedside to see patient.  Patient OK to discharge home.

## 2023-04-01 NOTE — Discharge Instructions (Signed)
Bone Marrow Aspiration and Bone Marrow Biopsy, Adult, Care After This sheet gives you information about how to care for yourself after your procedure. If you have problems or questions, contact your health care provider.  What can I expect after the procedure?  After the procedure, it is common to have: Mild pain and tenderness. Swelling. Bruising.  Follow these instructions at home: Take over-the-counter or prescription medicines only as told by your health care provider. You may shower tomorrow Remove band aid tomorrow, replace with another bandaid if  site has any drainage from biopsy site. Wash your hands with soap and water before you touch your biopsy site  If soap and water are not available, use hand sanitizer. Change your dressing frequently for bleeding and/or drainage. Check your puncture site every day for signs of infection. Check for: More redness, swelling, or pain. More fluid or blood. Warmth. Pus or a bad smell. Return to your normal activities in 24hours.  Do not drive for 24 hours if you were given a medicine to help you relax (sedative). Keep all follow-up visits as told by your health care provider. This is important. Contact a health care provider if: You have more redness, swelling, or pain around the puncture site. You have more fluid or blood coming from the puncture site. Your puncture site feels warm to the touch. You have pus or a bad smell coming from the puncture site. You have a fever. Your pain is not controlled with medicine. This information is not intended to replace advice given to you by your health care provider. Make sure you discuss any questions you have with your health care provider. Document Released: 11/21/2004 Document Revised: 11/22/2015 Document Reviewed: 10/16/2015 Elsevier Interactive Patient Education  2018 [employer] 

## 2023-04-05 LAB — SURGICAL PATHOLOGY

## 2023-04-07 ENCOUNTER — Inpatient Hospital Stay: Payer: Medicare HMO | Attending: Oncology

## 2023-04-07 ENCOUNTER — Inpatient Hospital Stay: Payer: Medicare HMO | Admitting: Oncology

## 2023-04-08 ENCOUNTER — Encounter (HOSPITAL_COMMUNITY): Payer: Self-pay

## 2023-04-13 ENCOUNTER — Encounter (HOSPITAL_COMMUNITY): Payer: Self-pay

## 2024-01-17 DEATH — deceased
